# Patient Record
Sex: Female | Born: 1970 | Hispanic: No | Marital: Married | Smoking: Never smoker
Health system: Southern US, Community
[De-identification: ages and names within clinical notes are randomized; demographics above are authoritative.]

---

## 2001-06-12 ENCOUNTER — Ambulatory Visit (HOSPITAL_COMMUNITY): Admission: RE | Admit: 2001-06-12 | Discharge: 2001-06-12 | Payer: Self-pay | Admitting: Internal Medicine

## 2001-06-12 ENCOUNTER — Encounter: Payer: Self-pay | Admitting: Internal Medicine

## 2001-09-29 ENCOUNTER — Ambulatory Visit (HOSPITAL_COMMUNITY): Admission: RE | Admit: 2001-09-29 | Discharge: 2001-09-29 | Payer: Self-pay | Admitting: *Deleted

## 2001-11-05 ENCOUNTER — Encounter: Admission: RE | Admit: 2001-11-05 | Discharge: 2001-11-05 | Payer: Self-pay | Admitting: *Deleted

## 2001-11-12 ENCOUNTER — Encounter: Admission: RE | Admit: 2001-11-12 | Discharge: 2001-11-12 | Payer: Self-pay | Admitting: *Deleted

## 2001-11-20 ENCOUNTER — Ambulatory Visit (HOSPITAL_COMMUNITY): Admission: RE | Admit: 2001-11-20 | Discharge: 2001-11-20 | Payer: Self-pay | Admitting: *Deleted

## 2001-11-26 ENCOUNTER — Encounter: Admission: RE | Admit: 2001-11-26 | Discharge: 2001-11-26 | Payer: Self-pay | Admitting: *Deleted

## 2001-12-10 ENCOUNTER — Encounter: Admission: RE | Admit: 2001-12-10 | Discharge: 2001-12-10 | Payer: Self-pay | Admitting: *Deleted

## 2001-12-24 ENCOUNTER — Encounter: Admission: RE | Admit: 2001-12-24 | Discharge: 2001-12-24 | Payer: Self-pay | Admitting: *Deleted

## 2001-12-30 ENCOUNTER — Ambulatory Visit (HOSPITAL_COMMUNITY): Admission: RE | Admit: 2001-12-30 | Discharge: 2001-12-30 | Payer: Self-pay | Admitting: *Deleted

## 2001-12-31 ENCOUNTER — Encounter: Admission: RE | Admit: 2001-12-31 | Discharge: 2001-12-31 | Payer: Self-pay | Admitting: *Deleted

## 2001-12-31 ENCOUNTER — Encounter (HOSPITAL_COMMUNITY): Admission: AD | Admit: 2001-12-31 | Discharge: 2002-01-30 | Payer: Self-pay | Admitting: *Deleted

## 2002-01-07 ENCOUNTER — Encounter: Admission: RE | Admit: 2002-01-07 | Discharge: 2002-01-07 | Payer: Self-pay | Admitting: Obstetrics and Gynecology

## 2002-01-21 ENCOUNTER — Encounter: Admission: RE | Admit: 2002-01-21 | Discharge: 2002-01-21 | Payer: Self-pay | Admitting: *Deleted

## 2002-01-28 ENCOUNTER — Encounter: Admission: RE | Admit: 2002-01-28 | Discharge: 2002-01-28 | Payer: Self-pay | Admitting: *Deleted

## 2002-02-11 ENCOUNTER — Encounter: Admission: RE | Admit: 2002-02-11 | Discharge: 2002-02-11 | Payer: Self-pay | Admitting: *Deleted

## 2002-02-18 ENCOUNTER — Encounter: Admission: RE | Admit: 2002-02-18 | Discharge: 2002-02-18 | Payer: Self-pay | Admitting: *Deleted

## 2002-02-18 ENCOUNTER — Ambulatory Visit (HOSPITAL_COMMUNITY): Admission: RE | Admit: 2002-02-18 | Discharge: 2002-02-18 | Payer: Self-pay | Admitting: *Deleted

## 2002-02-25 ENCOUNTER — Encounter: Admission: RE | Admit: 2002-02-25 | Discharge: 2002-02-25 | Payer: Self-pay | Admitting: *Deleted

## 2002-02-26 ENCOUNTER — Ambulatory Visit (HOSPITAL_COMMUNITY): Admission: RE | Admit: 2002-02-26 | Discharge: 2002-02-26 | Payer: Self-pay | Admitting: *Deleted

## 2002-03-04 ENCOUNTER — Encounter: Admission: RE | Admit: 2002-03-04 | Discharge: 2002-03-04 | Payer: Self-pay | Admitting: *Deleted

## 2002-03-11 ENCOUNTER — Encounter: Admission: RE | Admit: 2002-03-11 | Discharge: 2002-03-11 | Payer: Self-pay | Admitting: *Deleted

## 2002-03-16 ENCOUNTER — Observation Stay (HOSPITAL_COMMUNITY): Admission: EM | Admit: 2002-03-16 | Discharge: 2002-03-17 | Payer: Self-pay | Admitting: *Deleted

## 2002-03-17 ENCOUNTER — Encounter: Payer: Self-pay | Admitting: *Deleted

## 2002-03-25 ENCOUNTER — Encounter: Admission: RE | Admit: 2002-03-25 | Discharge: 2002-03-25 | Payer: Self-pay | Admitting: *Deleted

## 2002-03-27 ENCOUNTER — Inpatient Hospital Stay (HOSPITAL_COMMUNITY): Admission: AD | Admit: 2002-03-27 | Discharge: 2002-03-27 | Payer: Self-pay | Admitting: Obstetrics and Gynecology

## 2002-03-31 ENCOUNTER — Encounter: Admission: RE | Admit: 2002-03-31 | Discharge: 2002-03-31 | Payer: Self-pay | Admitting: *Deleted

## 2002-03-31 ENCOUNTER — Inpatient Hospital Stay (HOSPITAL_COMMUNITY): Admission: AD | Admit: 2002-03-31 | Discharge: 2002-03-31 | Payer: Self-pay | Admitting: Obstetrics and Gynecology

## 2002-03-31 ENCOUNTER — Encounter: Payer: Self-pay | Admitting: Obstetrics and Gynecology

## 2002-04-07 ENCOUNTER — Encounter: Admission: RE | Admit: 2002-04-07 | Discharge: 2002-04-07 | Payer: Self-pay | Admitting: *Deleted

## 2002-04-09 ENCOUNTER — Encounter (HOSPITAL_COMMUNITY): Admission: RE | Admit: 2002-04-09 | Discharge: 2002-04-09 | Payer: Self-pay | Admitting: Family Medicine

## 2002-04-11 ENCOUNTER — Encounter (INDEPENDENT_AMBULATORY_CARE_PROVIDER_SITE_OTHER): Payer: Self-pay

## 2002-04-11 ENCOUNTER — Inpatient Hospital Stay (HOSPITAL_COMMUNITY): Admission: AD | Admit: 2002-04-11 | Discharge: 2002-04-15 | Payer: Self-pay | Admitting: *Deleted

## 2002-04-26 ENCOUNTER — Inpatient Hospital Stay (HOSPITAL_COMMUNITY): Admission: AD | Admit: 2002-04-26 | Discharge: 2002-04-28 | Payer: Self-pay | Admitting: *Deleted

## 2002-05-03 ENCOUNTER — Inpatient Hospital Stay (HOSPITAL_COMMUNITY): Admission: AD | Admit: 2002-05-03 | Discharge: 2002-05-03 | Payer: Self-pay | Admitting: *Deleted

## 2002-05-25 ENCOUNTER — Encounter (INDEPENDENT_AMBULATORY_CARE_PROVIDER_SITE_OTHER): Payer: Self-pay | Admitting: *Deleted

## 2002-05-25 ENCOUNTER — Encounter: Admission: RE | Admit: 2002-05-25 | Discharge: 2002-05-25 | Payer: Self-pay | Admitting: *Deleted

## 2002-07-15 ENCOUNTER — Encounter: Admission: RE | Admit: 2002-07-15 | Discharge: 2002-07-15 | Payer: Self-pay | Admitting: *Deleted

## 2002-08-24 ENCOUNTER — Encounter: Admission: RE | Admit: 2002-08-24 | Discharge: 2002-08-24 | Payer: Self-pay | Admitting: *Deleted

## 2002-08-31 ENCOUNTER — Encounter: Payer: Self-pay | Admitting: Emergency Medicine

## 2002-08-31 ENCOUNTER — Emergency Department (HOSPITAL_COMMUNITY): Admission: EM | Admit: 2002-08-31 | Discharge: 2002-08-31 | Payer: Self-pay | Admitting: Emergency Medicine

## 2002-11-25 ENCOUNTER — Inpatient Hospital Stay (HOSPITAL_COMMUNITY): Admission: AD | Admit: 2002-11-25 | Discharge: 2002-11-25 | Payer: Self-pay | Admitting: Obstetrics & Gynecology

## 2002-12-02 ENCOUNTER — Encounter: Admission: RE | Admit: 2002-12-02 | Discharge: 2002-12-02 | Payer: Self-pay | Admitting: Obstetrics and Gynecology

## 2002-12-07 ENCOUNTER — Ambulatory Visit (HOSPITAL_COMMUNITY): Admission: RE | Admit: 2002-12-07 | Discharge: 2002-12-07 | Payer: Self-pay | Admitting: *Deleted

## 2003-03-03 ENCOUNTER — Encounter: Admission: RE | Admit: 2003-03-03 | Discharge: 2003-03-03 | Payer: Self-pay | Admitting: Obstetrics and Gynecology

## 2003-07-19 ENCOUNTER — Emergency Department (HOSPITAL_COMMUNITY): Admission: EM | Admit: 2003-07-19 | Discharge: 2003-07-19 | Payer: Self-pay | Admitting: Family Medicine

## 2004-02-18 ENCOUNTER — Ambulatory Visit (HOSPITAL_COMMUNITY): Admission: RE | Admit: 2004-02-18 | Discharge: 2004-02-18 | Payer: Self-pay | Admitting: Infectious Diseases

## 2004-08-20 ENCOUNTER — Other Ambulatory Visit: Admission: RE | Admit: 2004-08-20 | Discharge: 2004-08-20 | Payer: Self-pay | Admitting: Family Medicine

## 2005-07-02 ENCOUNTER — Encounter (INDEPENDENT_AMBULATORY_CARE_PROVIDER_SITE_OTHER): Payer: Self-pay | Admitting: Specialist

## 2005-07-02 ENCOUNTER — Inpatient Hospital Stay (HOSPITAL_COMMUNITY): Admission: RE | Admit: 2005-07-02 | Discharge: 2005-07-05 | Payer: Self-pay | Admitting: Obstetrics and Gynecology

## 2008-03-07 ENCOUNTER — Ambulatory Visit (HOSPITAL_COMMUNITY): Admission: RE | Admit: 2008-03-07 | Discharge: 2008-03-07 | Payer: Self-pay | Admitting: Obstetrics & Gynecology

## 2008-04-19 ENCOUNTER — Inpatient Hospital Stay (HOSPITAL_COMMUNITY): Admission: AD | Admit: 2008-04-19 | Discharge: 2008-04-19 | Payer: Self-pay | Admitting: Obstetrics & Gynecology

## 2008-04-27 ENCOUNTER — Ambulatory Visit (HOSPITAL_COMMUNITY): Admission: RE | Admit: 2008-04-27 | Discharge: 2008-04-27 | Payer: Self-pay | Admitting: Family Medicine

## 2008-05-11 ENCOUNTER — Ambulatory Visit (HOSPITAL_COMMUNITY): Admission: RE | Admit: 2008-05-11 | Discharge: 2008-05-11 | Payer: Self-pay | Admitting: Obstetrics & Gynecology

## 2008-05-26 ENCOUNTER — Inpatient Hospital Stay (HOSPITAL_COMMUNITY): Admission: RE | Admit: 2008-05-26 | Discharge: 2008-05-26 | Payer: Self-pay | Admitting: Obstetrics & Gynecology

## 2008-05-26 ENCOUNTER — Ambulatory Visit: Payer: Self-pay | Admitting: Physician Assistant

## 2008-06-02 ENCOUNTER — Ambulatory Visit: Payer: Self-pay | Admitting: Obstetrics & Gynecology

## 2008-06-23 ENCOUNTER — Ambulatory Visit: Payer: Self-pay | Admitting: Family Medicine

## 2008-07-07 ENCOUNTER — Ambulatory Visit: Payer: Self-pay | Admitting: Obstetrics & Gynecology

## 2008-07-07 ENCOUNTER — Encounter: Payer: Self-pay | Admitting: Family Medicine

## 2008-07-07 LAB — CONVERTED CEMR LAB: GC Probe Amp, Genital: NEGATIVE

## 2008-07-08 ENCOUNTER — Encounter: Payer: Self-pay | Admitting: Family Medicine

## 2008-07-14 ENCOUNTER — Ambulatory Visit: Payer: Self-pay | Admitting: Family Medicine

## 2008-07-21 ENCOUNTER — Ambulatory Visit: Payer: Self-pay | Admitting: Family Medicine

## 2008-07-24 ENCOUNTER — Ambulatory Visit: Payer: Self-pay | Admitting: Obstetrics & Gynecology

## 2008-07-24 ENCOUNTER — Inpatient Hospital Stay (HOSPITAL_COMMUNITY): Admission: AD | Admit: 2008-07-24 | Discharge: 2008-07-27 | Payer: Self-pay | Admitting: Obstetrics & Gynecology

## 2010-05-30 IMAGING — CR DG CHEST 2V
2 series · 2 of 2 positions shown · non-contrast
Comparison: February 18, 2004.

CLINICAL DATA: Cough and congestion in 24-week pregnant patient

CHEST - 2 VIEW

[view not recorded (1 of 2)]
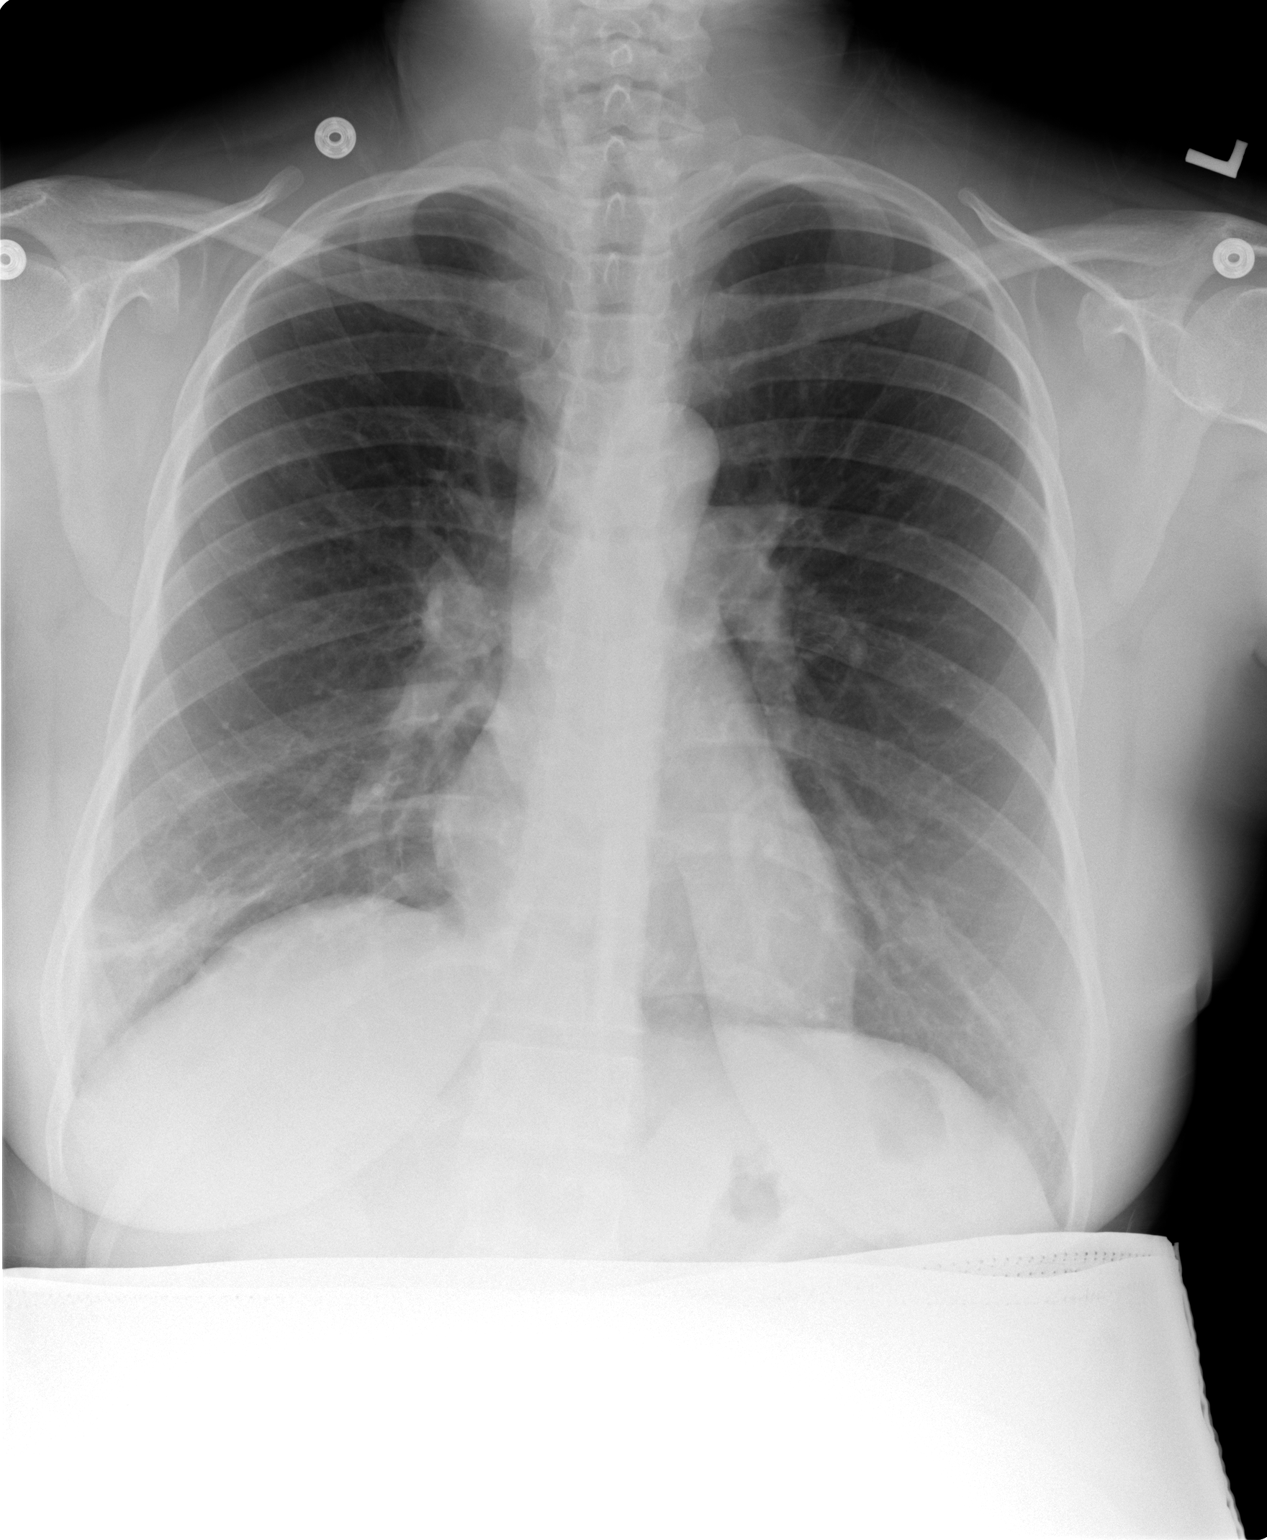

[view not recorded (2 of 2)]
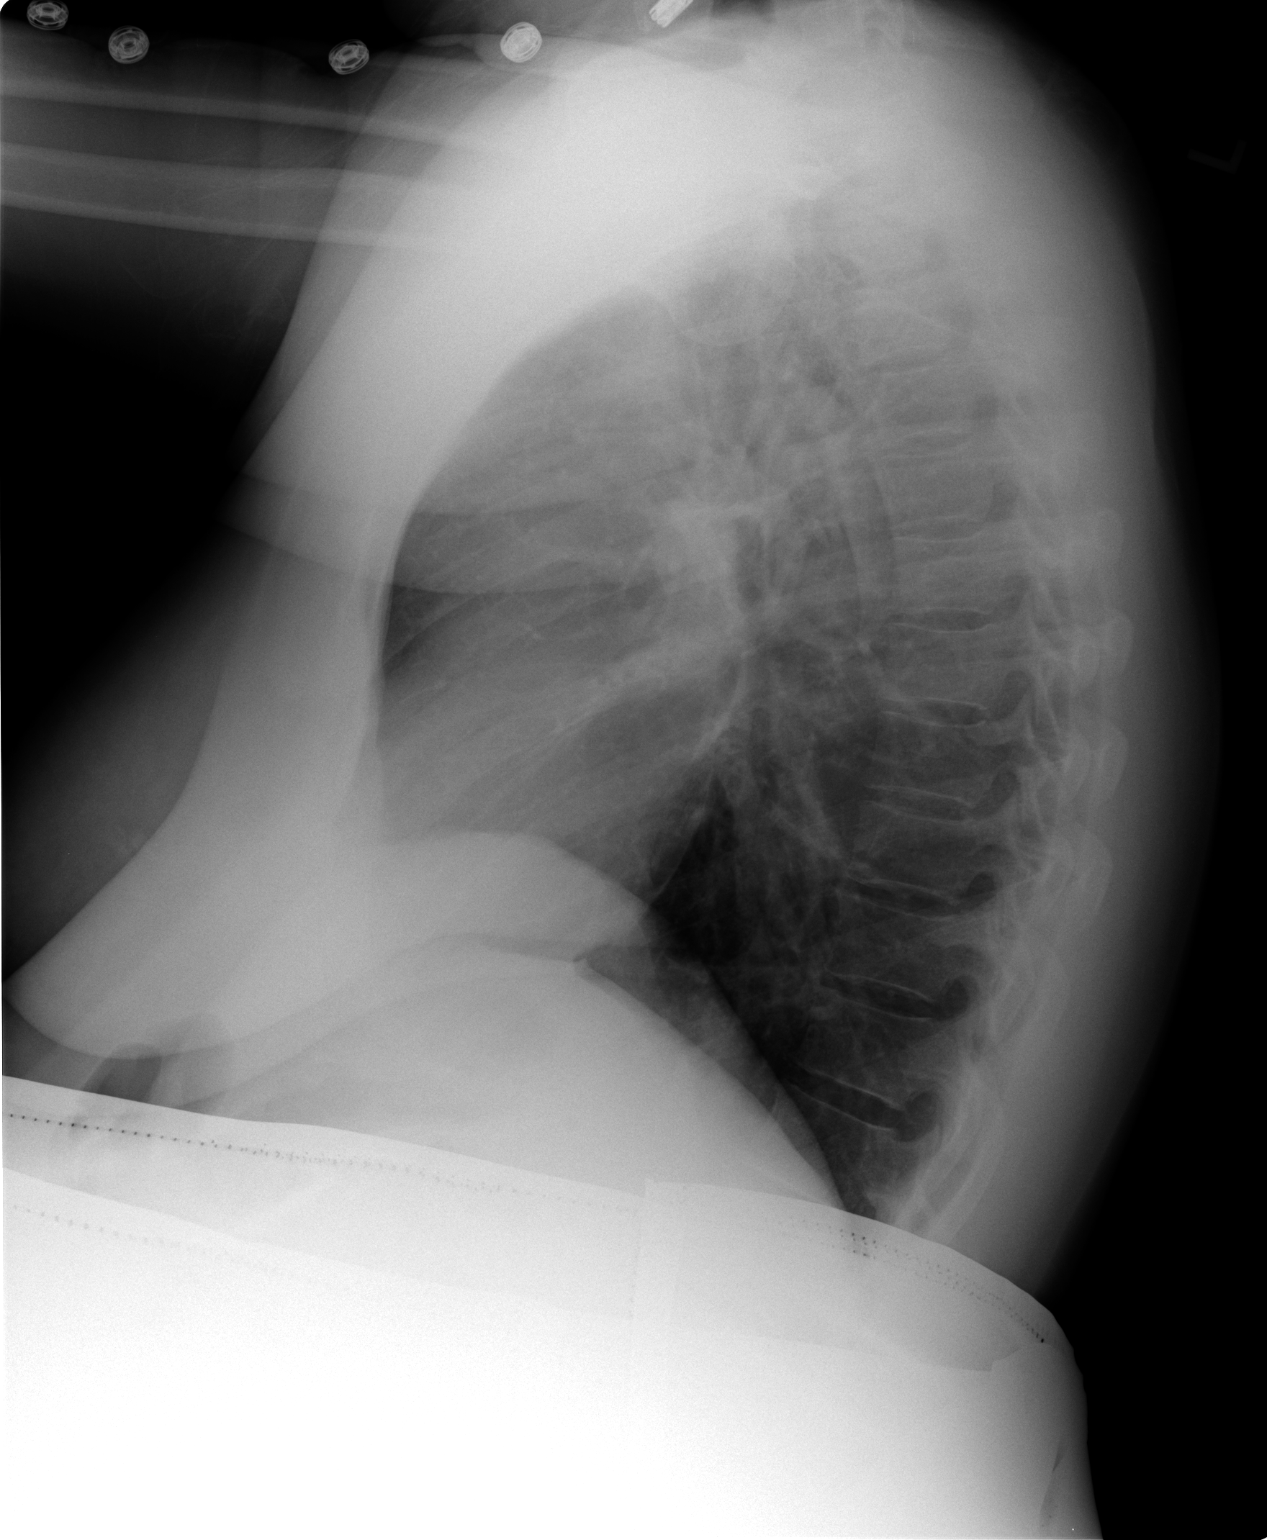

[2 of 2 positions shown; findings below may reference images not displayed]

FINDINGS: There is an infiltrate in the posterior lateral right
lower lobe.   No pleural effusion is identified.  The left lung is
clear.  The cardiac silhouette, mediastinum, pulmonary vasculature
are within normal limits
IMPRESSION: Right lower lobe pneumonia

## 2010-06-04 ENCOUNTER — Ambulatory Visit: Payer: Self-pay | Admitting: Nurse Practitioner

## 2010-06-04 DIAGNOSIS — J45909 Unspecified asthma, uncomplicated: Secondary | ICD-10-CM | POA: Insufficient documentation

## 2010-06-04 DIAGNOSIS — D259 Leiomyoma of uterus, unspecified: Secondary | ICD-10-CM | POA: Insufficient documentation

## 2010-06-04 DIAGNOSIS — J309 Allergic rhinitis, unspecified: Secondary | ICD-10-CM | POA: Insufficient documentation

## 2010-06-11 ENCOUNTER — Ambulatory Visit (HOSPITAL_COMMUNITY)
Admission: RE | Admit: 2010-06-11 | Discharge: 2010-06-11 | Payer: Self-pay | Source: Home / Self Care | Attending: Internal Medicine | Admitting: Internal Medicine

## 2010-06-12 ENCOUNTER — Telehealth (INDEPENDENT_AMBULATORY_CARE_PROVIDER_SITE_OTHER): Payer: Self-pay | Admitting: Nurse Practitioner

## 2010-06-19 ENCOUNTER — Encounter (INDEPENDENT_AMBULATORY_CARE_PROVIDER_SITE_OTHER): Payer: Self-pay | Admitting: *Deleted

## 2010-06-21 IMAGING — US US OB FOLLOW-UP
1 series · 14 of 28 positions shown · non-contrast
Comparison: none

OBSTETRICAL ULTRASOUND:
 This ultrasound exam was performed in the [HOSPITAL] Ultrasound Department.  The OB US report was generated in the AS system, and faxed to the ordering physician.  This report is also available in [REDACTED] PACS.

[Series 1: us ob follow-up · 0.31mm/px · 14 of 42 slices shown]
[im 2/42]
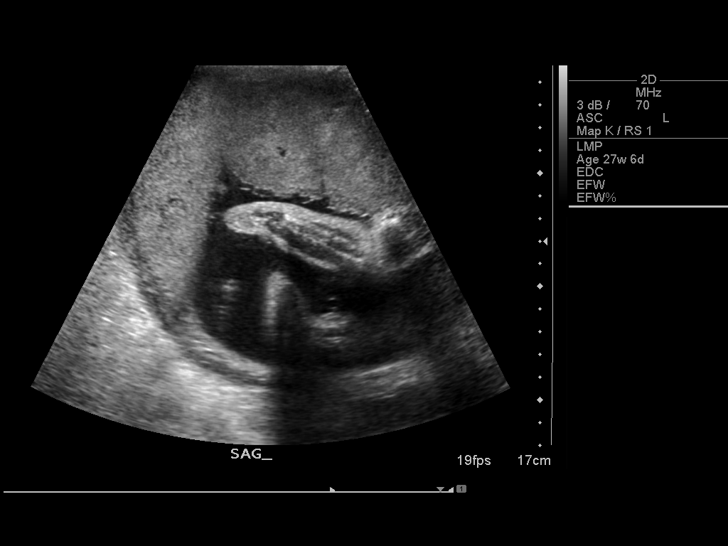
[im 5/42]
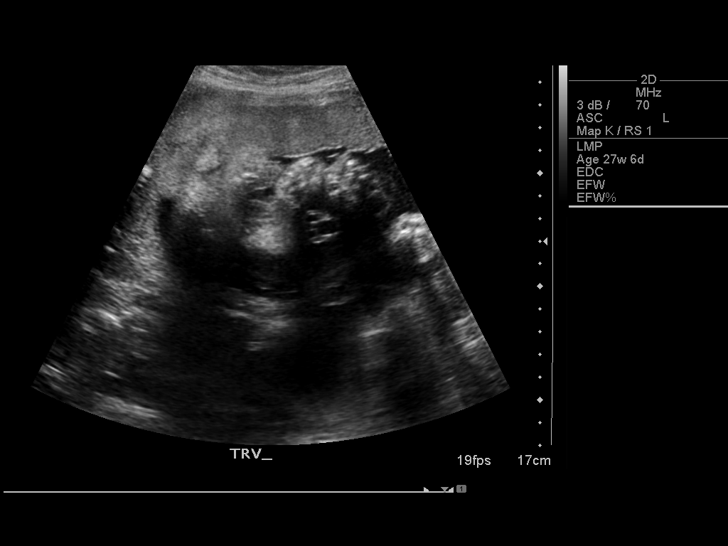
[im 8/42]
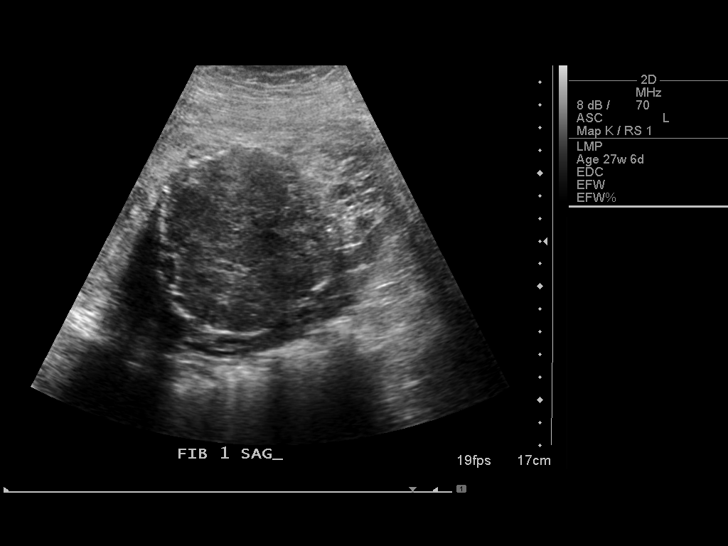
[im 11/42]
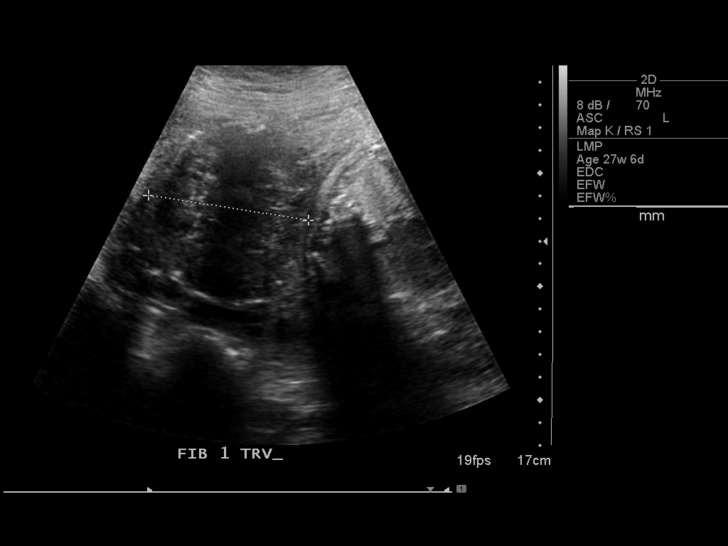
[im 14/42]
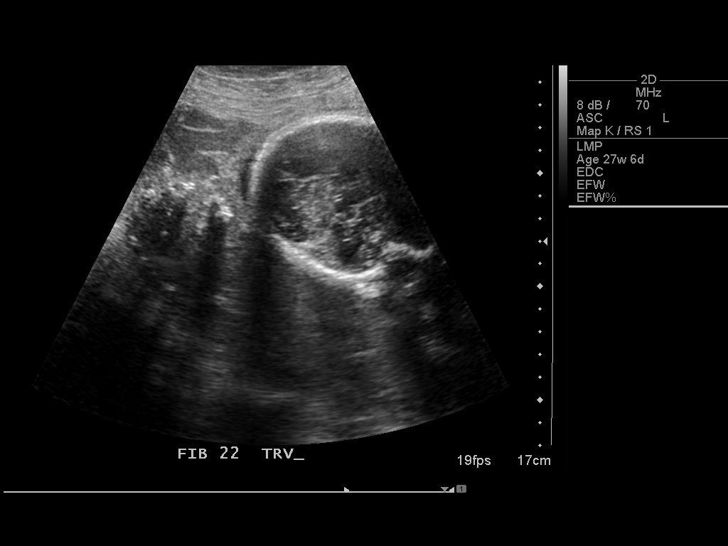
[im 17/42]
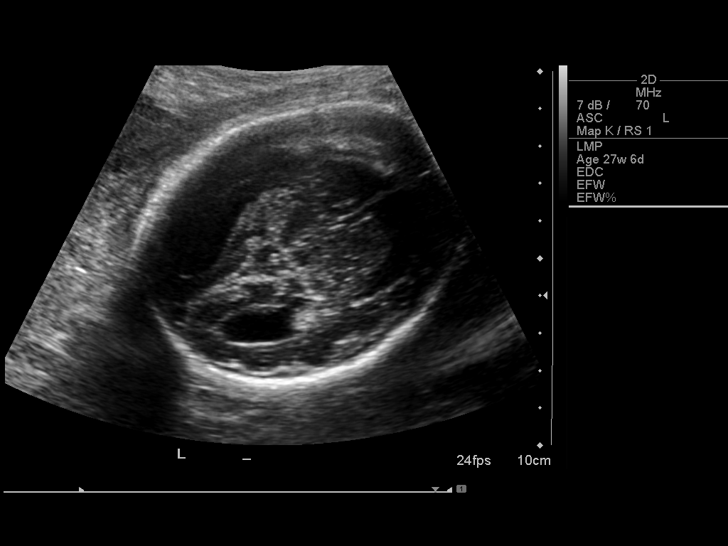
[im 20/42]
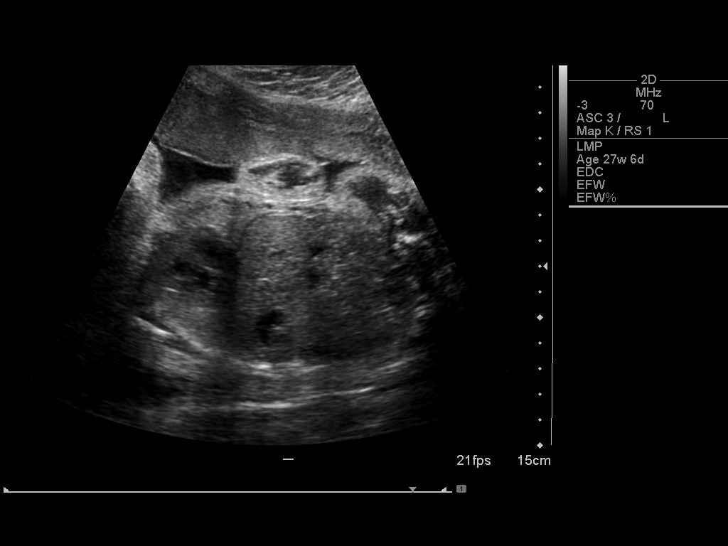
[im 23/42]
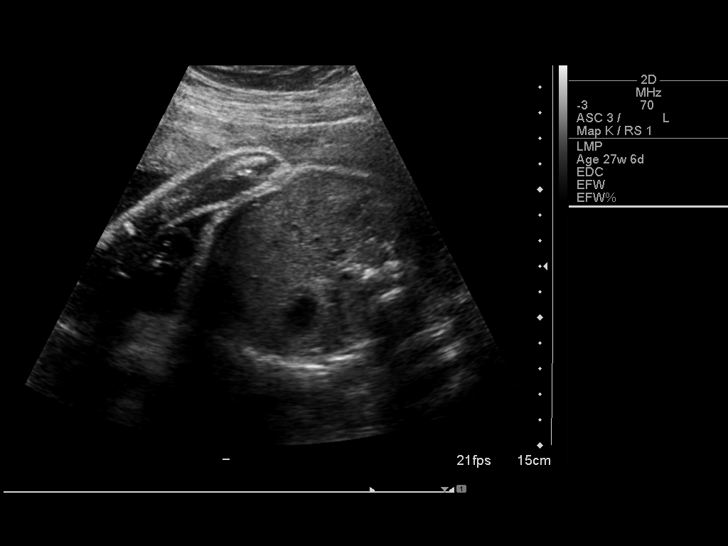
[im 26/42]
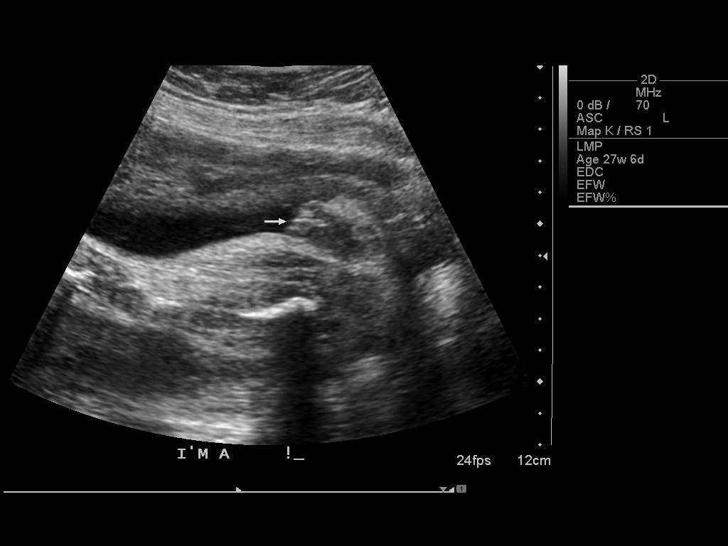
[im 29/42]
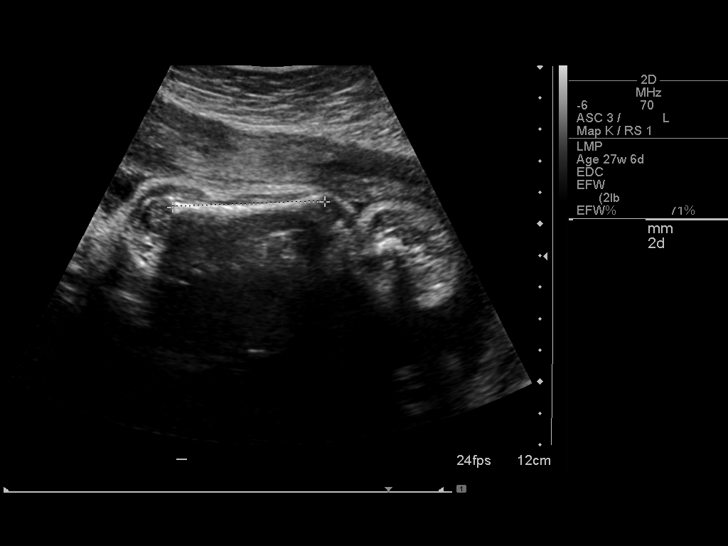
[im 32/42]
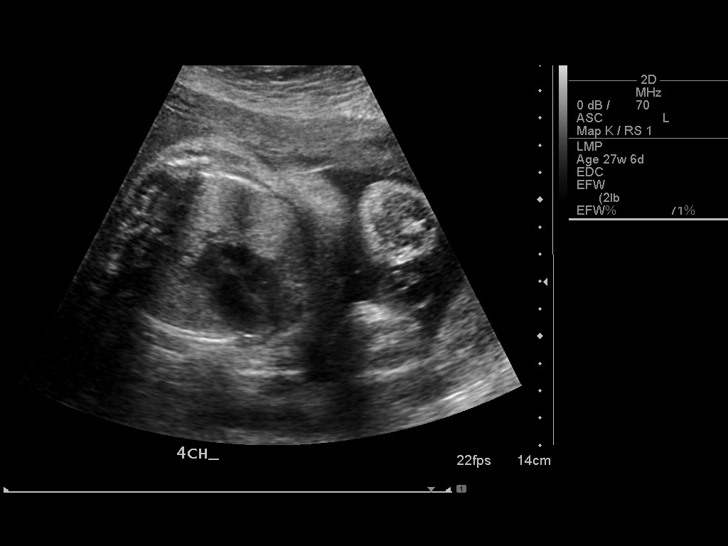
[im 35/42]
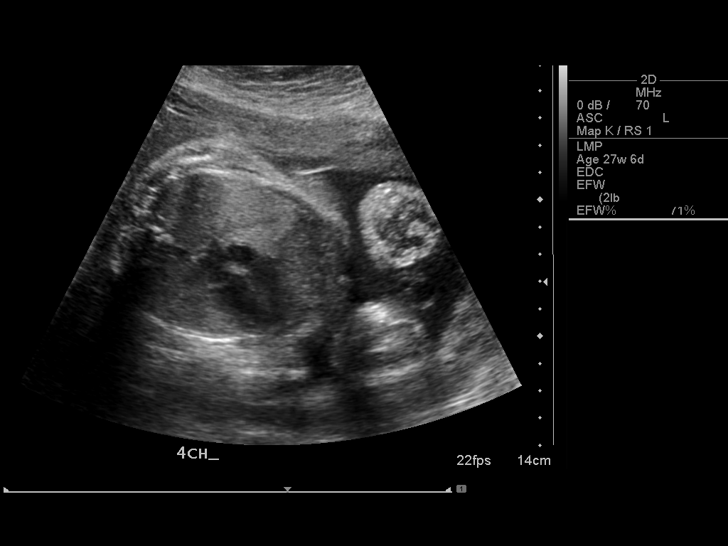
[im 38/42]
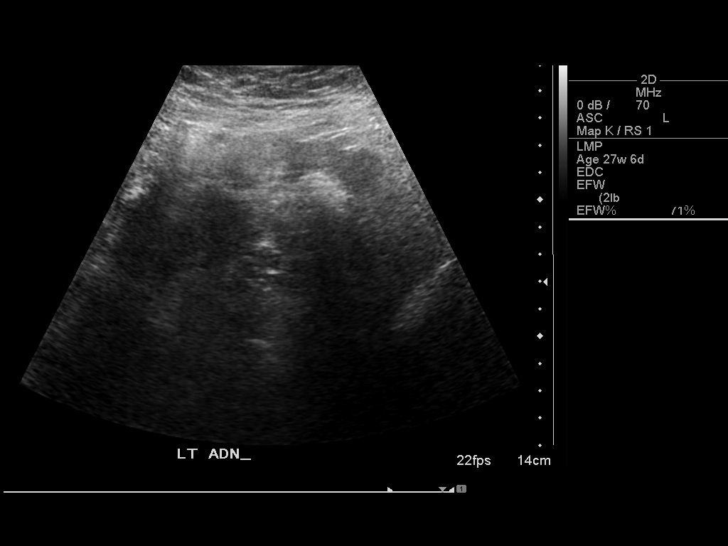
[im 42/42]
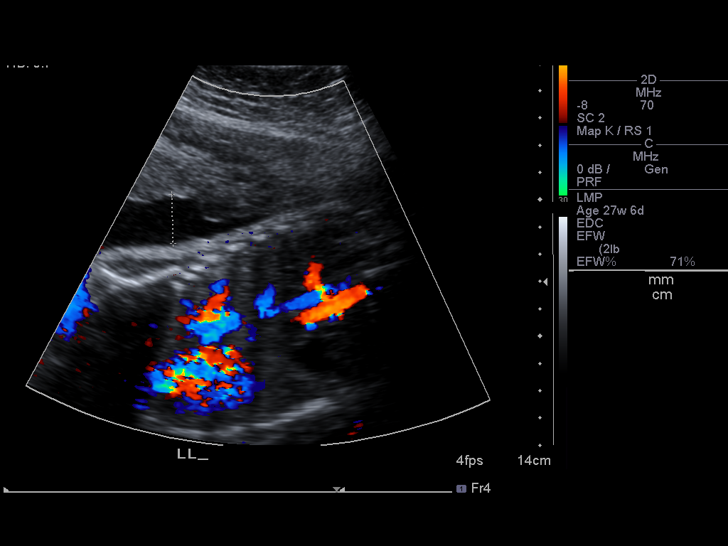

[14 of 28 positions shown; findings below may reference images not displayed]

IMPRESSION: See AS Obstetric US report.

## 2010-07-19 ENCOUNTER — Ambulatory Visit
Admission: RE | Admit: 2010-07-19 | Discharge: 2010-07-19 | Payer: Self-pay | Source: Home / Self Care | Attending: Nurse Practitioner | Admitting: Nurse Practitioner

## 2010-07-19 DIAGNOSIS — N949 Unspecified condition associated with female genital organs and menstrual cycle: Secondary | ICD-10-CM | POA: Insufficient documentation

## 2010-07-19 DIAGNOSIS — N938 Other specified abnormal uterine and vaginal bleeding: Secondary | ICD-10-CM | POA: Insufficient documentation

## 2010-07-26 NOTE — Assessment & Plan Note (Signed)
Summary: NEW - Establish care   Vital Signs:  Patient profile:   40 year old female Menstrual status:  regular LMP:     05/20/2010 Height:      61.25 inches Weight:      1917 pounds BMI:     360.56 O2 Sat:      100 % on Room air Temp:     98.1 degrees F oral Pulse rate:   96 / minute Pulse rhythm:   regular Resp:     20 per minute BP sitting:   120 / 80  (left arm) Cuff size:   regular  Vitals Entered By: Levon Hedger (June 04, 2010 2:41 PM)  Nutrition Counseling: Patient's BMI is greater than 25 and therefore counseled on weight management options.  O2 Flow:  Room air  Serial Vital Signs/Assessments:                                PEF    PreRx  PostRx Time      O2 Sat  O2 Type     L/min  L/min  L/min   By           100 %   Room air                          Levon Hedger  Comments: P/F  200,  210,  200 By: Levon Hedger   Is Patient Diabetic? No Pain Assessment Patient in pain? yes       Does patient need assistance? Functional Status Self care Ambulation Normal LMP (date): 05/20/2010 LMP - Character: heavy     Menstrual flow (days): 5-6 Menstrual Status regular Enter LMP: 05/20/2010   History of Present Illness:  Pt into the office to establish care. No recent  hospital or urgent care visit. No previous PCP  Social - umployed at this time  Asthma History    Initial Asthma Severity Rating:    Age range: 12+ years    Symptoms: 0-2 days/week    Nighttime Awakenings: >1/week but not nightly    Interferes w/ normal activity: minor limitations    Exacerbations requiring oral systemic steroids: 0-1/year    Asthma Severity Assessment: Moderate Persistent    Habits & Providers  Alcohol-Tobacco-Diet     Alcohol drinks/day: 0     Tobacco Status: never  Exercise-Depression-Behavior     Have you felt down or hopeless? no     Have you felt little pleasure in things? no     Depression Counseling: not indicated; screening negative for  depression     Drug Use: never  Medications Prior to Update: 1)  None  Allergies (verified): No Known Drug Allergies  Past History:  Past Surgical History: c-section x 3  Family History: mother - diabetes, htn, cva father - prostate cancer (deceased at age 19) Sisters x 4 with thyriod problems  Social History: 3 children married Tobacco - none ETOH - none Drug - none Originally from [place] Status:  never Drug Use:  never  Review of Systems General:  Denies fever. ENT:  Complains of nasal congestion and sinus pressure; denies sore throat. CV:  Denies chest pain or discomfort. Resp:  Complains of cough, shortness of breath, and wheezing; denies sputum productive. MS:  Complains of low back pain. Neuro:  Complains of headaches. Allergy:  Complains of itching eyes.  Physical Exam  General:  alert.   Head:  normocephalic.   Ears:  bil TM with bony landmarks present Nose:  inflammed turbinates (extremed) Lungs:  decreases bases Heart:  normal rate and regular rhythm.   Abdomen:  normal bowel sounds.   Msk:  up to the exam table Neurologic:  alert & oriented X3.   Skin:  color normal.   Psych:  Oriented X3.     Impression & Recommendations:  Problem # 1:  ASTHMA (ICD-493.90)  Her updated medication list for this problem includes:    Singulair 10 Mg Tabs (Montelukast sodium) ..... One tablet by mouth nightly for breathing    Ventolin Hfa 108 (90 Base) Mcg/act Aers (Albuterol sulfate) .Marland Kitchen... 2 puffs every 6 hours as needed for shortness of breath    Symbicort 160-4.5 Mcg/act Aero (Budesonide-formoterol fumarate) ..... One spray two times a day  Orders: Peak Flow Rate (94150) Pulse Oximetry (single measurment) (94760)  Problem # 2:  ALLERGIC RHINITIS (ICD-477.9)  Her updated medication list for this problem includes:    Claritin 10 Mg Tabs (Loratadine) ..... One tablet by mouth daily    Rhinocort Aqua 32 Mcg/act Susp (Budesonide) ..... One spray in each  nostril two times a day  Problem # 3:  FIBROIDS, UTERUS (ICD-218.9)  Orders: Ultrasound (Ultrasound)  Problem # 4:  NEED PROPHYLACTIC VACCINATION&INOCULATION FLU (ICD-V04.81) given today  Complete Medication List: 1)  Multivitamins Caps (Multiple vitamin) .... One tablet by mouth daily 2)  Claritin 10 Mg Tabs (Loratadine) .... One tablet by mouth daily 3)  Singulair 10 Mg Tabs (Montelukast sodium) .... One tablet by mouth nightly for breathing 4)  Ventolin Hfa 108 (90 Base) Mcg/act Aers (Albuterol sulfate) .... 2 puffs every 6 hours as needed for shortness of breath 5)  Symbicort 160-4.5 Mcg/act Aero (Budesonide-formoterol fumarate) .... One spray two times a day 6)  Rhinocort Aqua 32 Mcg/act Susp (Budesonide) .... One spray in each nostril two times a day  Other Orders: Flu Vaccine 33yrs + (81191) Admin 1st Vaccine (47829)  Asthma Management Plan    Asthma Severity: Moderate Persistent    Personal best PEF: 210 liters/minute    Predicted PEF: 474 liters/minute    Working PEF: 474 liters/minute    Plan based on PEF formula: [place] [employer] Zone: (Range: 380 to 470) SYMBICORT 160-4.5 MCG/ACT AERO:  1 inhalation twice a day SINGULAIR 10 MG TABS:  1 tablet daily  Yellow Zone: VENTOLIN HFA 108 (90 BASE) MCG/ACT AERS:  2 puffs every 4-6 hours  Red Zone: Call your physician for shortness of breath.    Patient Instructions: 1)  You can start taking a decongestant such as claritin D to help with nasal congestion. 2)  Will start rhinocort one spray in each nostril two times a day (hold head down) 3)  Asthma - will start singulair 10mg  by mouth nightly for asthma. 4)  Also use symbicort - 1 puff by mouth two times a day (sample) until your next visit 5)  You have been the flu vaccine today. 6)  Fibroid - you will be referred for a pelvic and transvaginal ultrasound.  This will check both your uterus and ovaries. 7)  Call next week for a follow up appointment in 4-6 weeks for  asthma and allergies 8)  You will need a complete physical exam scheduled at next visit Prescriptions: RHINOCORT AQUA 32 MCG/ACT SUSP (BUDESONIDE) One spray in each nostril two times a day  #1 x 3   Entered and Authorized by:  Lehman Prom FNP   Signed by:   Lehman Prom FNP on 06/04/2010   Method used:   Faxed to ...       [facility] (retail)       [address] [place], [place]  [address]       Ph: [phone]       Fax: [phone]   RxID:   [id number] SYMBICORT 160-4.5 MCG/ACT AERO (BUDESONIDE-FORMOTEROL FUMARATE) One spray two times a day  #1 x 0   Entered and Authorized by:   Lehman Prom FNP   Signed by:   Lehman Prom FNP on 06/04/2010   Method used:   Samples Given   RxID:   [id number] VENTOLIN HFA 108 (90 BASE) MCG/ACT AERS (ALBUTEROL SULFATE) 2 puffs every 6 hours as needed for shortness of breath  #1 x 1   Entered and Authorized by:   Lehman Prom FNP   Signed by:   Lehman Prom FNP on 06/04/2010   Method used:   Faxed to ...       [facility] (retail)       [address] [place], [place]  [address]       Ph: [phone]       Fax: [phone]   RxID:   [id number] SINGULAIR 10 MG TABS (MONTELUKAST SODIUM) One tablet by mouth nightly for breathing  #30 x 5   Entered and Authorized by:   Lehman Prom FNP   Signed by:   Lehman Prom FNP on 06/04/2010   Method used:   Faxed to ...       [facility] (retail)       [address] [place], [place]  [address]       Ph: [phone]       Fax: [phone]   RxID:   [id number]    Orders Added: 1)  Peak Flow Rate [94150] 2)  Pulse Oximetry (single measurment) [94760] 3)  New Patient Level III [99203] 4)  Ultrasound [Ultrasound] 5)  Flu Vaccine 9yrs + [90658] 6)  Admin 1st Vaccine [90471]   Immunizations Administered:  Influenza  Vaccine # 1:    Vaccine Type: Fluvax 3+    Site: left deltoid    Mfr: GlaxoSmithKline    Dose: 0.5 ml    Route: IM    Given by: Levon Hedger    Exp. Date: 12/22/2010    Lot #: [id number]    VIS given: 01/16/10 version given June 04, 2010.  Flu Vaccine Consent Questions:    Do you have a history of severe allergic reactions to this vaccine? no    Any prior history of allergic reactions to egg and/or gelatin? no    Do you have a sensitivity to the preservative Thimersol? no    Do you have a past history of Guillan-Barre Syndrome? no    Do you currently have an acute febrile illness? no    Have you ever had a severe reaction to latex? no    Vaccine information given and explained to patient? yes    Are you currently pregnant? no   Immunizations Administered:  Influenza Vaccine # 1:    Vaccine Type: Fluvax 3+    Site: left deltoid    Mfr: GlaxoSmithKline    Dose: 0.5 ml    Route: IM  Given by: Levon Hedger    Exp. Date: 12/22/2010    Lot #: [id number]    VIS given: 01/16/10 version given June 04, 2010.  Prevention & Chronic Care Immunizations   Influenza vaccine: Fluvax 3+  (06/04/2010)    Tetanus booster: Not documented    Pneumococcal vaccine: Not documented  Other Screening   Pap smear: Not documented   Smoking status: never  (06/04/2010)  Lipids   Total Cholesterol: Not documented   LDL: Not documented   LDL Direct: Not documented   HDL: Not documented   Triglycerides: Not documented   Nursing Instructions: Give Flu vaccine today

## 2010-07-26 NOTE — Letter (Signed)
Summary: TEST ORDER FORM//ULTRASOUND//APPT DATE & TIME  TEST ORDER FORM//ULTRASOUND//APPT DATE & TIME   Imported By: Arta Bruce 06/05/2010 16:16:43  _____________________________________________________________________  External Attachment:    Type:   Image     Comment:   External Document

## 2010-07-26 NOTE — Letter (Signed)
Summary: Handout Printed  Printed Handout:  - Fibroids (Leiomyoma's) 

## 2010-07-26 NOTE — Assessment & Plan Note (Signed)
Summary: Fibroids   Vital Signs:  Patient profile:   40 year old female Menstrual status:  irregular LMP:     06/14/2010 Weight:      190.6 pounds Pulse rate:   80 / minute Pulse rhythm:   regular Resp:     20 per minute BP sitting:   116 / 84  (left arm) Cuff size:   regular  Vitals Entered By: Levon Hedger (July 19, 2010 12:07 PM) CC: results from u/s Is Patient Diabetic? No Pain Assessment Patient in pain? no       Does patient need assistance? Functional Status Self care Ambulation Normal LMP (date): 06/14/2010 EDC by LMP==> 03/21/2011 EDC 03/21/2011 LMP - Character: heavy    Menses interval (days): 28 Menstrual flow (days): 5-6 Menstrual Status irregular Enter LMP: 06/14/2010   CC:  results from u/s.  History of Present Illness:  Pt into the office for f/u on ultrasound. Pt has ultrasound on last month which reveals multiple uterine fibroids. Pt missed her menses this month.  It is about 3-5 days late. pt has concerns that she is pregnant.  Currently has a baby that is almost 82 years old. Pt is still breast feeding. C/o breast tenderness now more than usual.  Allergies: No Known Drug Allergies  Review of Systems General:  +breast tenderness . CV:  Denies chest pain or discomfort. Resp:  Denies cough. GI:  Denies abdominal pain, nausea, and vomiting.  Physical Exam  General:  alert.   Head:  normocephalic.   Msk:  normal ROM.   Neurologic:  alert & oriented X3.   Skin:  color normal.   Psych:  Oriented X3.     Impression & Recommendations:  Problem # 1:  FIBROIDS, UTERUS (ICD-218.9) reviewed results with pt  Problem # 2:  DELAYED MENSES (ICD-626.8) pregnancy test positive today pt will need to establish at OB-GYN will start prenatal vitamins Orders: Urine Pregnancy Test  (04540)  Complete Medication List: 1)  Multivitamins Caps (Multiple vitamin) .... One tablet by mouth daily 2)  Claritin 10 Mg Tabs (Loratadine) .... One  tablet by mouth daily 3)  Singulair 10 Mg Tabs (Montelukast sodium) .... One tablet by mouth nightly for breathing 4)  Ventolin Hfa 108 (90 Base) Mcg/act Aers (Albuterol sulfate) .... 2 puffs every 6 hours as needed for shortness of breath 5)  Symbicort 160-4.5 Mcg/act Aero (Budesonide-formoterol fumarate) .... One spray two times a day 6)  Rhinocort Aqua 32 Mcg/act Susp (Budesonide) .... One spray in each nostril two times a day 7)  Prenatal/folic Acid Tabs (Prenatal vit-fe fumarate-fa) .... One tablet by mouth daily  Patient Instructions: 1)  Your pregnancy test is positive. 2)  LMP (date): 06/14/2010 3)  EDC by LMP==> 03/21/2011 4)  EDC 03/21/2011 5)  You should establish with an OB-GYN for your prenatal care. 6)  Start prenatal vitamins  Prescriptions: PRENATAL/FOLIC ACID  TABS (PRENATAL VIT-FE FUMARATE-FA) One tablet by mouth daily  #30 x 9   Entered and Authorized by:   Lehman Prom FNP   Signed by:   Lehman Prom FNP on 07/19/2010   Method used:   Print then Give to Patient   RxID:   [id number]    Orders Added: 1)  Est. Patient Level III [08657] 2)  Urine Pregnancy Test  [81025]    Laboratory Results   Urine Tests  Date/Time Received: July 19, 2010 1:00 PM     Urine HCG: positive

## 2010-07-26 NOTE — Letter (Signed)
Summary: *HSN Results Follow up  [facility]  [address] [place], [place] [address]   Phone: [phone]  Fax: [phone]      06/19/2010   Nancy [name] [address] [place], [place]  [address]   Dear  Ms. Ryan Deason,                            ____S.Drinkard,FNP   ____D. Gore,FNP       ____B. McPherson,MD   ____V. Rankins,MD    ____E. Mulberry,MD    ____N. Daphine Deutscher, FNP  ____D. Reche Dixon, MD    ____K. Philipp Deputy, MD    ____Other     This letter is to inform you that your recent test(s):  _______Pap Smear    _______Lab Test     _______X-ray    _______ is within acceptable limits  _______ requires a medication change  _______ requires a follow-up lab visit  _______ requires a follow-up visit with your provider   Comments:  We have been trying to reach you at [phone].  Please contact the office at your earliest convenience.       _________________________________________________________ If you have any questions, please contact our office                     Sincerely,  Levon Hedger [facility]

## 2010-07-26 NOTE — Progress Notes (Signed)
Summary: Ultrasound results  Phone Note Outgoing Call   Summary of Call: notify pt that her ultrasound shows that she has multiple uterine fibroids this is likely the cause for irregular and heavy periods pt should have a f/u appt and I can discuss further with her at that time but wanted to let her know results now Initial call taken by: Lehman Prom FNP,  June 12, 2010 8:20 AM  Follow-up for Phone Call        Levon Hedger  June 13, 2010 1:36 PM Left message on machine for pt to return call to the office.   Left message on answering machine for pt to call back...Marland KitchenMarland KitchenArmenia Shannon  June 15, 2010 10:11 AM  Levon Hedger  June 19, 2010 3:05 PM Left message on machine for pt to return call to the office.  Will mail letter  Follow-up by: Levon Hedger,  June 19, 2010 3:06 PM

## 2010-08-02 ENCOUNTER — Inpatient Hospital Stay (HOSPITAL_COMMUNITY)
Admission: AD | Admit: 2010-08-02 | Discharge: 2010-08-02 | Disposition: A | Discharge status: Home / Self Care | Payer: Medicaid Other | Source: Ambulatory Visit | Attending: Obstetrics & Gynecology | Admitting: Obstetrics & Gynecology

## 2010-08-02 DIAGNOSIS — O210 Mild hyperemesis gravidarum: Secondary | ICD-10-CM

## 2010-08-02 LAB — URINALYSIS, ROUTINE W REFLEX MICROSCOPIC
Bilirubin Urine: NEGATIVE
Ketones, ur: NEGATIVE mg/dL
Nitrite: NEGATIVE
Urine Glucose, Fasting: NEGATIVE mg/dL
Urobilinogen, UA: 0.2 mg/dL (ref 0.0–1.0)
pH: 5.5 (ref 5.0–8.0)

## 2010-08-02 LAB — URINE MICROSCOPIC-ADD ON

## 2010-08-02 LAB — CBC
HCT: 36.1 % (ref 36.0–46.0)
MCV: 85.5 fL (ref 78.0–100.0)
Platelets: 326 10*3/uL (ref 150–400)

## 2010-08-02 LAB — COMPREHENSIVE METABOLIC PANEL
ALT: 19 U/L (ref 0–35)
Alkaline Phosphatase: 66 U/L (ref 39–117)
BUN: 8 mg/dL (ref 6–23)
Glucose, Bld: 93 mg/dL (ref 70–99)
Potassium: 4.2 mEq/L (ref 3.5–5.1)

## 2010-08-02 LAB — POCT PREGNANCY, URINE: Preg Test, Ur: POSITIVE

## 2010-10-08 LAB — POCT URINALYSIS DIP (DEVICE)
Bilirubin Urine: NEGATIVE
Glucose, UA: NEGATIVE mg/dL
Glucose, UA: 100 mg/dL — AB
Ketones, ur: NEGATIVE mg/dL
Nitrite: NEGATIVE
Protein, ur: 30 mg/dL — AB
Protein, ur: 100 mg/dL — AB
Urobilinogen, UA: 0.2 mg/dL (ref 0.0–1.0)
pH: 5.5 (ref 5.0–8.0)
pH: 5.5 (ref 5.0–8.0)

## 2010-10-08 LAB — RPR: RPR Ser Ql: NONREACTIVE

## 2010-10-08 LAB — CBC
Hemoglobin: 12.5 g/dL (ref 12.0–15.0)
RBC: 4.27 MIL/uL (ref 3.87–5.11)
RDW: 14.7 % (ref 11.5–15.5)
WBC: 12.4 10*3/uL — ABNORMAL HIGH (ref 4.0–10.5)

## 2010-10-09 LAB — CBC
Hemoglobin: 9.4 g/dL — ABNORMAL LOW (ref 12.0–15.0)
MCHC: 33.1 g/dL (ref 30.0–36.0)
MCV: 88.8 fL (ref 78.0–100.0)
RBC: 3.19 MIL/uL — ABNORMAL LOW (ref 3.87–5.11)
WBC: 11.7 10*3/uL — ABNORMAL HIGH (ref 4.0–10.5)

## 2010-11-06 NOTE — Op Note (Signed)
NAMEGERI, HEPLER                ACCOUNT NO.:  [id number]   MEDICAL RECORD NO.:  [id number]          PATIENT TYPE:  INP   LOCATION:  9146                          FACILITY:  [facility]   PHYSICIAN:  Scheryl Darter, MD       DATE OF BIRTH:  29-Sep-1970   DATE OF PROCEDURE:  07/24/2008  DATE OF DISCHARGE:                               OPERATIVE REPORT   PROCEDURE:  Repeat low transverse cesarean section.   PREOPERATIVE DIAGNOSES:  Intrauterine pregnancy, 38 and 1/2-week  gestation, rupture of membranes, and 2 previous cesarean sections.   POSTOPERATIVE DIAGNOSES:  1. Intrauterine pregnancy, delivered.  2. Live born female.  Apgars 9 and 9.  Weight 6 pounds 7 ounces.   SURGEON:  Scheryl Darter, MD   ASSISTANTS:  Myra C. Marice Potter, MD   ANESTHESIA:  Spinal.   ESTIMATED BLOOD LOSS:  800 mL.   IV FLUIDS:  1500 mL crystalloid.   URINE OUTPUT:  200 mL.   COMPLICATIONS:  None.   DRAINS:  Foley catheter.   COUNTS:  Correct.   HOSPITAL COURSE:  The patient gave written consent for repeat cesarean  section 38-1/2-weeks gestation with rupture of membranes.  The patient's  identification was confirmed.  She was brought to OR and spinal  anesthesia was induced.  She was placed in dorsal supine position with a  left lateral tilt.  Foley catheter was placed.  Abdomen was thoroughly  prepped and draped.  A #10 blade was used to make a Pfannenstiel  incision inside her previous cesarean section scar.  Incision was  carried down to the fascia and the fascia was incised transversely with  curved Mayo scissors.  The fascia was separated from underlying tissue  attachments with blunt and sharp dissection.  The peritoneal cavity was  entered and the incision was extended vertically with Mayo scissors.  The bladder blade was inserted.  The uterine segment was exposed.  A #10  blade was used make a transverse incision at midline and the incision  was extended transversely with bandage scissors.  Fetal head  was  elevated and delivered.  Mouth and nose were cleared with bulb suction.  Double nuchal cord was released.  Infant was delivered and was vigorous  at birth.  The cord was clamped and cut and the infant was handed to  nursery personnel and was a live born female born at 66.  Apgars 9 and  9.  Weight 6 pounds 7 ounces.  Placenta was removed from the uterine  cavity.  Uterine cavity was explored.  The patient received IV Pitocin.  She had received IV Kefzol prior to incision.  Bladder blade was re-  inserted.  A 0-Vicryl suture was used to close the incision with a  locking suture.  A second layer followed with 0 Vicryl.  Hemostasis was  obtained with interrupted sutures of Vicryl.  Good hemostasis was  assured.  Both adnexa were inspected and were normal.  The anterior  peritoneum was closed with a running suture of 2-0 Vicryl.  Fascia was  closed with running suture with 0 Vicryl.  Incision was irrigated and  hemostasis was assured with cautery.  Skin incision was closed with a  running subcuticular suture with 4-0 Vicryl.  Sterile dressing was  applied.  The patient tolerated the procedure well without  complications.  She was brought in stable condition to the recovery  room.  Estimated blood loss 800 mL.      Scheryl Darter, MD     JA/MEDQ  D:  07/24/2008  T:  07/25/2008  Job:  [id number]

## 2010-11-06 NOTE — Discharge Summary (Signed)
Nancy [name], Nancy [name]                ACCOUNT NO.:  [id number]   MEDICAL RECORD NO.:  [id number]          PATIENT TYPE:  INP   LOCATION:  9146                          FACILITY:  [facility]   PHYSICIAN:  Scheryl Darter, MD       DATE OF BIRTH:  19-Apr-1971   DATE OF ADMISSION:  07/24/2008  DATE OF DISCHARGE:  07/27/2008                               DISCHARGE SUMMARY   DISCHARGE SUMMARY:  MRN [id number]  DOB 1971/01/06.  Attendging: Dr. Scheryl Darter.   DISCHARGE DIAGNOSES:  1. Repeat low-transverse cesarean section.  2. Asthma.  3. Fibroids.   DISCHARGE MEDICATIONS:  1. Percocet 5/325 mg 1 tablet p.o. q.4 h. p.r.n. pain.  2. Ibuprofen 600 mg 1 tablet p.o. q.6 h. p.r.n. pain.  3. Prenatal vitamin 1 tablet p.o. daily.  4. Colace 100 mg 1 tablet p.o. b.i.d. p.r.n. constipation.  5. Singulair  6. Albuterol 1 puff every 4 hours as needed for wheezing.  7. Flovent 2 puffs b.i.d.  8. Claritin 1 tablet p.o. daily.   CONSULTANTS:  None.   LABORATORY DATA:  1. CBC on July 24, 2008:  WBC 12.4, hemoglobin 12.5, hematocrit      37.7, platelet count 256.  2. CBC on July 25, 2008:  WBC 11.7, hemoglobin 9.4, hematocrit      28.4, platelet count 193.   BRIEF HOSPITAL COURSE:  The patient is a 41 year old G3, P3-0-0-3 who  was admitted on July 24, 2008.  1. Pregnancy: The patient was at 38 and 3 weeks.  The patient had      spontaneous rupture of membranes.  The patient has a history of two      prior C-sections so patient was taken to the operating room for a      repeat low-transverse cesarean section by Dr. Scheryl Darter.  She      delivered a viable female infant.  The procedure was performed in      normal fashion without complications.  Apgars were 9 and 9.  Birth      weight was 6 pounds 7 ounces.  Estimated blood loss was 800 mL.      The patient was given 1500 mL of fluids and a Foley catheter was      placed.  The patient tolerated the procedure well.  For her      postoperative  pain, the patient does have some fibroids which seem      to be contributing to her pain.  The patient had decreased pain      throughout each day.  The patient's incision was clean, dry, and      intact.  The patient's fundus was firm and patient had minimal      bleeding.  On the day of discharge the patient was tolerating      p.o.'s without difficulty, was ambulating, and was agreeable with      discharge.  2. Asthma: After the operation the patient was complaining of some      shortness of breath and some asthma type symptoms.  The patient has  a history of asthma.  The patient was placed on albuterol as well      as on her allergy medicines Singulair and Claritin.  The patient      was noted to have some expiratory wheezes as well as some decreased      air movement in bilateral bases.  Chest x-ray was obtained which      was normal.  The patient was using her albuterol regularly so we      placed the patient on Flovent.  We also gave the patient some      albuterol nebs which seemed to improve her symptoms.  On the day of      discharge, the patient was breathing comfortably on room air      without any increased work of breathing.   DISCHARGE INSTRUCTIONS:  The patient is to have pelvic rest for 6 weeks.  The patient has no restrictions on her diet.  The patient should  increase her activity slowly.  The patient should keep the incision  clean and dry.   FOLLOWUP:  The patient is to follow up with the [facility]  Department in 6 weeks.   DISCHARGE CONDITION:  Stable.      Angelena Sole, MD  Electronically Signed     ______________________________  Scheryl Darter, MD    WS/MEDQ  D:  07/27/2008  T:  07/27/2008  Job:  [id number]

## 2010-11-09 NOTE — Discharge Summary (Signed)
Nancy [name], DRUMMER                ACCOUNT NO.:  [id number]   MEDICAL RECORD NO.:  [id number]          PATIENT TYPE:  INP   LOCATION:  9101                          [facility]ILITY[facility]  [facility]   PHYSICIAN:  Richardean Sale, M.D.   DATE OF BIRTH:  09-Sep-1970   DATE OF ADMISSION:  07/02/2005  DATE OF DISCHARGE:  07/05/2005                                 DISCHARGE SUMMARY   ADMISSION DIAGNOSES:  1.  Intrauterine pregnancy at 38+ week with spontaneous rupture of      membranes.  2.  History of prior cesarean section for failure to progress and multiple      large uterine fibroids for repeat cesarean section.   DISCHARGE DIAGNOSES:  1.  Intrauterine pregnancy at 38+ week with spontaneous rupture of      membranes.  2.  History of prior cesarean section for failure to progress and multiple      large uterine fibroids for repeat cesarean section.  3.  Anemia.   OPERATION/PROCEDURE:  Repeat low transverse cesarean section performed on  July 02, 2005.   OPERATIVE FINDINGS:  Viable female infant, Apgars 9 and 9.  Nuchal cord x3.  Intact placenta with three-vessel cord.  Two large uterine fibroids, each  approximately 10 cm.  Normal-appearing adnexa.   HOSPITAL COURSE AND HISTORY OF PRESENT ILLNESS:  For details, please see  admission history and physical.  Briefly, this is a 40 year old, gravida 2,  para 1 female who was at 38+ weeks gestation with a history of a prior  cesarean section for planned repeat cesarean section who presented on  July 02, 2005, complaining of loss of fluid and was found to have  spontaneous rupture of membranes.  The patient underwent an uncomplicated  repeat cesarean section on July 02, 2005, resulting in delivery of a  viable female infant.  Her postoperative course was unremarkable.  The patient  remained afebrile throughout her hospitalization.  On postoperative day #3,  she was ambulating well, voiding without difficulty.  Was tolerating a  regular diet.  Her pain  was adequately controlled and she was subsequent  discharged to home.   DISPOSITION:  To home.   CONDITION:  Stable.   FOLLOW UP:  The patient will follow up in four weeks for a routine  postoperative visit.  She was given instructions to call for any fever  greater than 100.4, increasing vaginal bleeding, pain not relieved by pain  medications or any drainage or redness around her incision.   MEDICATIONS:  1.  Percocet one to two tablets p.o. q.4-6h. p.r.n. pain.  2.  Ferrous sulfate one p.o. b.i.d. for the patient's anemia.   LABORATORY DATA:  Postoperative day #1, hemoglobin 9, hematocrit 26.5, white  count 11.6, platelet count 203.      Richardean Sale, M.D.  Electronically Signed     JW/MEDQ  D:  07/28/2005  T:  07/29/2005  Job:  [id number]
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
## 2010-11-09 NOTE — Discharge Summary (Signed)
   [name], [name]                            ACCOUNT NO.:  [id number]   MEDICAL RECORD NO.:  [id number]                   PATIENT TYPE:  INP   LOCATION:  9146                                 [facility]   PHYSICIAN:  Jonah Blue, M.D.                DATE OF BIRTH:  30-May-1971   DATE OF ADMISSION:  04/11/2002  DATE OF DISCHARGE:                                 DISCHARGE SUMMARY   PRIMARY PHYSICIAN:  [facility]N Clinic.   DISCHARGE DIAGNOSES:  1. Status post low transverse cesarean section secondary to intrapartum     fever and arrest of descent.  2. Delivery of a viable female infant.  3. Postoperative endometritis.  4. Uterine fibroids.   DISCHARGE MEDICATIONS:  1. Ibuprofen 600 mg p.o. q.6h. p.r.n. pain.  2. Percocet 5/325 mg one to two tablets p.o. q.4-6h. p.r.n. severe pain.  3. Ampicillin 500 mg p.o. q.6h. x6 days.  4. Depo-Provera 150 mg IM every three months.  5. Prenatal vitamins one p.o. q.d. x6 weeks or while breast feeding.  6. Iron sulfate 325 mg p.o. t.i.d. with meals.  7. Stool softener of choice p.r.n. constipation.   FOLLOW-UP:  The patient is to follow up at the [facility] on May 25, 2002 at 1 p.m.   ADMISSION HISTORY AND PHYSICAL:  A 40 year old G1 P0 at 18 and two weeks  gestation admitted in labor.  The patient had SROM in MAU and UCs every five  minutes prior to admission.  Pregnancy complicated by myomatous uterus,  labial HSV, and anemia.   HOSPITAL COURSE:  The patient was admitted in early labor and progressed  under epidural anesthesia, eventually developing protracted labor with  adequate contractions and intrapartum fever eventually requiring low  transverse C section performed by Dr. Orlene Erm. A viable female infant was  delivered on April 11, 2002 at 2122 with Apgars of 6 and 9.  The patient  did have postoperative fever requiring a slightly prolonged hospital stay  which eventually resolved and was believed to be secondary to  endometritis.  The patient will continue a 10-day course of p.o. antibiotics, having  received four days of IV antibiotics in the hospital.  She developed no new  concerns throughout her hospitalization.                                               Jonah Blue, M.D.    Milas Gain  D:  04/15/2002  T:  04/15/2002  Job:  [id number]   cc:   [facility]N Clinic

## 2010-11-09 NOTE — Discharge Summary (Signed)
NAME:  Nancy [name], Nancy [name]                            ACCOUNT NO.:  [id number]   MEDICAL RECORD NO.:  [id number]                   PATIENT TYPE:  INP   LOCATION:  9135                                 [facility]   PHYSICIAN:  Tanya S. Shawnie Pons, M.D.                DATE OF BIRTH:  1970/08/14   DATE OF ADMISSION:  04/26/2002  DATE OF DISCHARGE:  04/28/2002                                 DISCHARGE SUMMARY   DISCHARGE DIAGNOSES:  1. Endometritis status post intravenous antibiotic therapy.  2. Two weeks postoperative low transverse cesarean section.  3. Fibroids.  4. History of abnormal Pap.   DISCHARGE MEDICATIONS:  1. Azithromycin 350 mg one p.o. q.d. x7 days.  2. Cleocin 450 mg one p.o. q.6h. x7 days.   DISCHARGE INSTRUCTIONS:  Activity, diet, and wound care as previously for  routine postpartum/post cesarean section care.  The patient was instructed  to return sooner if she were to have fevers or increase in abdominal pain or  discharge or erythema around the incision site or other symptoms that were  to concern her.   FOLLOW UP:  The patient is to return to the maternity admissions unit at  [facility] in six to seven days for follow-up.  Recommended she return  on November 11 between the times of 12 noon and 5 p.m. so that I will be  able to see her then.   HOSPITAL COURSE:  The patient is a G1, P1 who presented to the MAU with  complaint of persistent abdominal pain and fever to 102 on day of admission.  She had been discharged from [facility] on October 23 following a low  transverse cesarean section delivery.  At that hospitalization she was  treated with IV gentamicin, ampicillin, and doxycycline for 48 hours  postoperative for endometritis.  In addition, she completed a six day course  of p.o. antibiotics following discharge.   PAST MEDICAL HISTORY:  Significant for being status post LTCS at 41 weeks,  fibroids, and seasonal allergies and asthma.   PHYSICAL  EXAMINATION:  VITAL SIGNS:  Temperature 102, pulse 110, blood  pressure 109/70.  PELVIC:  Enlarged, firm uterus that had diffuse tenderness on palpation, but  worse on the right lower quadrant.  Incision site was clean, dry, and intact  without erythema or drainage.   LABORATORIES:  Initial laboratories are notable for a white blood cell count  of 6.8 and elevated platelets 804,000.   The patient was admitted with a presumed diagnosis of late endometritis  versus abscess.  She underwent a CT of the abdomen and pelvis on the morning  of November 4 which revealed multiple large fibroids, likely degenerating.  No abscess or free fluid.  Mild right hydronephrosis which was likely  secondary to uterus and fibroids.  Please note a urinalysis on admission was  within normal limits.   At time of  admission patient was immediately started on IV  ampicillin/gentamicin/clindamycin.  Throughout the hospital course she  remained afebrile with a Tmax of 99.0 and her abdominal tenderness  decreased.  However, was noted to have tenderness on the right side more  than the left.  At the time of discharge blood cultures were negative x2.   At the time of discharge patient had been afebrile for almost 48 hours with  decreased pain.  She was discharged on p.o. antibiotics as above and  recommended to follow up also as above.     Douglass Rivers, M.D.                      Shelbie Proctor. Shawnie Pons, M.D.    CH/MEDQ  D:  04/28/2002  T:  04/29/2002  Job:  [id number]

## 2010-11-09 NOTE — Op Note (Signed)
Nancy [name], Nancy [name]                            ACCOUNT NO.:  [id number]   MEDICAL RECORD NO.:  [id number]                   PATIENT TYPE:  INP   LOCATION:  9146                                 [facility]   PHYSICIAN:  Mary Sella. Orlene Erm, M.D.                 DATE OF BIRTH:  March 07, 1971   DATE OF PROCEDURE:  04/11/2002  DATE OF DISCHARGE:                                 OPERATIVE REPORT   PREOPERATIVE DIAGNOSES:  1. This 40 year old gravida 1 female at term.  2. Nonreassuring fetal heart tracing.  3. Arrest of decent.  4. Uterine leiomyomata.  5. Thick meconium.   POSTOPERATIVE DIAGNOSES:  1. This 40 year old gravida 1 female at term.  2. Nonreassuring fetal heart tracing.  3. Arrest of decent.  4. Uterine leiomyomata.  5. Thick meconium.   PROCEDURE:  Primary low transverse cesarean section.   SURGEON:  Enid Cutter, M.D.   ASSISTANT:  Dr. Samara Deist   ANESTHESIA:  Epidural.   COMPLICATIONS:  None.   SPECIMENS:  Cord blood, cord gases, and placenta.   ESTIMATED BLOOD LOSS:  1000 cc.   FINDINGS:  Viable female infant delivered at 2122 with Apgars of 6 and 9  weighing 6 pounds and 2 ounces.   INDICATIONS FOR PROCEDURE:  The patient is a 40 year old gravida 1 black  female who presented with spontaneous rupture of membranes early in the  morning of April 11, 2002.  The patient was admitted to Labor and Delivery  and begun on Pitocin.  She progressed slowly to 4 cm.  An IUPC was placed  and the patient was thought to be adequate labor.  She continued to progress  slowly to approximately 6 cm dilatation.  She had arrest of labor for a  period of two hours.  At the time, the patient was afebrile and the fetal  heart rate tracing was reassuring.  The patient desired strongly to continue  with attempted vaginal delivery and the decision was made to allow her to  progress in labor.  The patient began developing a fever antepartum and was  started ampicillin and gentamicin  IV.  She was given Tylenol.  There was  increased fetal heart rate baseline with decrease in variability.  There was  repetitive variable decelerations.  The patient was counseled on the  nonreassuring status of the fetus.  She, however, elected to continue  attempt labor.  The patient progressed slowly to complete at approximately  1900 and began pushing.  The patient pushed for approximately an hour and  had no descent of the fetal presenting part.  Complete OA presentation was  some molding and mild caput.  At that time, the patient was counseled on the  need to progress with cesarean.  She elected to continue pushing.  The  patient continued pushing for a second hour with no further descent of the  fetal head.  The patient did at that time agree to proceed with a transverse  cesarean section.  The patient was counseled on the risk of bleeding,  infection, injury to internal organs.  The patient was counseled on the risk  of transfusion or emergent hysterectomy secondary to uncontrollable  hemorrhage.  The patient understands these risks and wishes to proceed.   PROCEDURE:  The patient was taken to the operating room where her epidural  anesthesia was bolused.  She was prepped and draped in sterile fashion.  A  Pfannenstiel incision was performed with a scalpel and carried down to the  underlying fascia.  The fascia was then dissected lateral with Mayo  scissors.  The fascia was separated from the underlying rectus muscle  bellies with sharp and blunt dissection.  The rectus muscle bellies were  separated and the underlying peritoneum was entered sharply.  The surgeon's  fingers were used to stretch the peritoneum and a bladder blade was placed.  A bladder flap was created with sharp and blunt dissection.  The uterus was  scored with a scalpel and the incision was carried down to the midline.  The  surgeon's fingers were used to extend the incision and the infant's head was  delivered  through the uterine incision.  The infant was DeLee suctioned on  the operative field.  The infant's body was delivered and the cord was  clamped and cut.  There was a true knot in the umbilical cord that was  tight.  The infant was handed off to the waiting neonatal resuscitation  team.  The cord gases were attempted to be drawn below the knot but there  was no return of blood.  At that point, the cord gases were drawn above the  knot in the cord.  The placenta was then manually extracted and the uterus  was curetted with a dry lap sponge.  The uterus was kept intra-abdominally  secondary to the large uterine leiomyomata.  The uterine incision was  repaired with a running, locking stitch of 0 chromic.  The second  imbricating layer was performed to obtain the final hemostasis.  The uterine  incision was inspected and found to be hemostatic.  The paracolonic gutters  were emptied of clot and debris and the pelvis was irrigated with copious  amounts of saline.  The fascia was then closed with a running stitch of 1  Vicryl.  The subcutaneous tissues were irrigated with copious amounts of  saline and the skin edges were reapproximated with staples.  The patient  tolerated the procedure well and returned to the recovery room in stable  condition.                                                Mary Sella. Orlene Erm, M.D.    EMH/MEDQ  D:  04/11/2002  T:  04/11/2002  Job:  [id number]

## 2010-11-09 NOTE — Op Note (Signed)
NAMESKYLEY, GRANDMAISON                ACCOUNT NO.:  [id number]   MEDICAL RECORD NO.:  [id number]          PATIENT TYPE:  INP   LOCATION:  9199                          [facility]   PHYSICIAN:  Richardean Sale, M.D.   DATE OF BIRTH:  Sep 02, 1970   DATE OF PROCEDURE:  07/02/2005  DATE OF DISCHARGE:                                 OPERATIVE REPORT   PREOPERATIVE DIAGNOSES:  Thirty-eight plus week intrauterine pregnancy with  history of prior cesarean section for failure to progress, now with  spontaneous rupture of membranes, also with large known large uterine  fibroid.   POSTOPERATIVE DIAGNOSES:  Thirty-eight plus week intrauterine pregnancy with  history of prior cesarean section for failure to progress, now with  spontaneous rupture of membranes, also with large known large uterine  fibroid.   PROCEDURE:  Repeat low transverse cesarean section.   SURGEON:  Richardean Sale, M.D.   ASSISTANT:  Cordelia Pen A. Rosalio Macadamia, M.D.   ANESTHESIA:  Spinal.   COMPLICATIONS:  None.   ESTIMATED BLOOD LOSS:  1000 mL.   URINE OUTPUT:  350 mL clear.   SPECIMEN:  Placenta to pathology.   OPERATIVE FINDINGS:  Viable female infant in the cephalic presentation with  Apgars of 9 and 9 and a nuchal cord x2.  Intact placenta with three-vessel  cord, sent to pathology.  A low-lying placenta.  Clear amniotic fluid.  Normal ovaries bilaterally.  At least two large fibroids, the largest of  which is approximately 10 cm, from the posterior aspect of the fundus.  No  evidence of any submucosal component.   INDICATIONS:  This is a 40 year old gravida 2, para 1, African female who is  at 76+ weeks' gestation with due date of July 11, 2005, who has had a  prior cesarean section for failure to deliver after three hours of pushing.  The patient complained of spontaneous rupture of membranes beginning on the  evening of July 01, 2005.  She presented to the office this morning, and  sterile speculum exam  confirmed gross rupture of membranes.  The patient was  contracting irregularly, reports good movement, and denied any bright red  vaginal bleeding.  A planned repeat cesarean section was already scheduled  for this July 05, 2005.  Prior to the procedure the risks, benefits and  alternatives of the procedure were reviewed with the patient in detail.  We  discussed the risks, which include but are not limited to hemorrhage  requiring transfusion, infection, injury to the bowel, the bladder, the  ureter or other organs, which could require additional surgery either at the  time of  this procedure or in the future.  We reviewed the possibility of  intra-abdominal scarring and anesthesia-related complications.  Also,  specifically to this case, given that the patient had a placenta previa in  this pregnancy that had resolved and the placenta was almost low-lying, as  well as the patient has multiple uterine fibroids, she was counseled on the  possibility of proceeding with emergent hysterectomy at the time of this  procedure.  The patient voiced understanding of all  these risks and agreed  to proceed.  Informed consent was obtained before proceeding to the OR.   PROCEDURE:  The patient was taken to the operating room, where she was given  a spinal anesthetic.  She was then prepped and draped in the usual sterile  fashion and placed in the supine position with a leftward tilt.  A Foley  catheter was placed, 10 mL of 0.25% plain Marcaine were then injected into  the area where the skin incision was to be made.  A Pfannenstiel skin  incision was then made through the patient's prior C-section incision.  This  was carried down to the fascia.  The fascia was then incised in midline and  the fascial incision was then extended with the Mayo scissors.  The fascia  was then grasped between two Kocher clamps, elevated, and the underlying  rectus muscles were dissected off with both sharp and blunt  dissection.  The  muscles were then separated in the midline.  The peritoneum was adherent to  the muscles and was entered at this time atraumatically.  The peritoneal  incision was then extended superiorly and inferiorly, and there was visual  visualization of the bladder.  The bladder blade was then inserted.  There  was some scarring of the bladder to the lower uterine segment, and there  were very large veins noted to be coursing through the lower uterine  segment.  The bladder flap was then very carefully created with sharp  dissection and once the bladder was displaced adequately downward, the  uterine incision was made in a low transverse fashion with the scalpel.  Upon entry into the uterus, there was scant amniotic fluid noted that was  clear, consistent with ruptured membranes.  The uterine incision was then  extended with the bandage scissors.  The vacuum was used to facilitate  delivery of the fetal vertex given the large uterine fibroid and the boggy  consistency of the uterus.  The head was delivered atraumatically.  The  vacuum was disconnected.  Nuchal cord x3 was reduced.  The infant was then  delivered to the sterile field, was bulb-suctioned.  The cord was clamped  and cut and the infant was handed off to the waiting NICU attendants.  The  cord gas and cord blood was obtained.  Apgars were 9/9.  The placenta was  then removed intact and was sent to pathology.  The uterine cavity was then  cleared with a moist laparotomy sponge, and there was no evidence of any  retained placental tissue.  The uterine incision was then repaired with 1-0  chromic in a running locked fashion and a second layer of the same suture  was placed in an imbricating fashion for additional hemostasis.  Additional  figure-of-eight sutures were placed in the uterine incision and once  hemostasis was confirmed, the adnexa were visualized and were normal.  The uterus was inspected.  It was still at 24  weeks' size, given the multiple  fibroids.  The pelvis was then irrigated copiously with warm normal saline.  The uterine incision was hemostatic.  The peritoneal and muscular surfaces  were inspected and any areas of bleeding were cauterized with the Bovie.  The fascia was then closed with a running Vicryl suture.  The subcutaneous  space was irrigated and any areas of bleeding were cauterized with the Bovie  and the skin was closed with staples.   The patient tolerated the procedure very well.  All sponge, lap,  needle and  instrument counts were correct x2.  She was taken to the recovery room awake  and in stable condition.  There are no complications.      Richardean Sale, M.D.  Electronically Signed     JW/MEDQ  D:  07/02/2005  T:  07/02/2005  Job:  [id number]

## 2011-01-08 ENCOUNTER — Other Ambulatory Visit (HOSPITAL_COMMUNITY): Payer: Self-pay | Admitting: Obstetrics and Gynecology

## 2011-01-08 DIAGNOSIS — O2690 Pregnancy related conditions, unspecified, unspecified trimester: Secondary | ICD-10-CM

## 2011-01-15 ENCOUNTER — Encounter (HOSPITAL_COMMUNITY): Payer: Self-pay

## 2011-01-15 ENCOUNTER — Ambulatory Visit (HOSPITAL_COMMUNITY)
Admission: RE | Admit: 2011-01-15 | Discharge: 2011-01-15 | Disposition: A | Discharge status: Home / Self Care | Payer: Medicaid Other | Source: Ambulatory Visit | Attending: Obstetrics and Gynecology | Admitting: Obstetrics and Gynecology

## 2011-01-15 DIAGNOSIS — O4400 Placenta previa specified as without hemorrhage, unspecified trimester: Secondary | ICD-10-CM | POA: Insufficient documentation

## 2011-01-15 DIAGNOSIS — O99810 Abnormal glucose complicating pregnancy: Secondary | ICD-10-CM | POA: Insufficient documentation

## 2011-01-15 DIAGNOSIS — Z363 Encounter for antenatal screening for malformations: Secondary | ICD-10-CM | POA: Insufficient documentation

## 2011-01-15 DIAGNOSIS — Z1389 Encounter for screening for other disorder: Secondary | ICD-10-CM | POA: Insufficient documentation

## 2011-01-15 DIAGNOSIS — O358XX0 Maternal care for other (suspected) fetal abnormality and damage, not applicable or unspecified: Secondary | ICD-10-CM | POA: Insufficient documentation

## 2011-01-15 DIAGNOSIS — O34219 Maternal care for unspecified type scar from previous cesarean delivery: Secondary | ICD-10-CM | POA: Insufficient documentation

## 2011-01-15 DIAGNOSIS — O2690 Pregnancy related conditions, unspecified, unspecified trimester: Secondary | ICD-10-CM

## 2011-01-15 DIAGNOSIS — O3410 Maternal care for benign tumor of corpus uteri, unspecified trimester: Secondary | ICD-10-CM | POA: Insufficient documentation

## 2011-01-15 DIAGNOSIS — O09529 Supervision of elderly multigravida, unspecified trimester: Secondary | ICD-10-CM | POA: Insufficient documentation

## 2011-01-16 ENCOUNTER — Encounter (HOSPITAL_COMMUNITY): Payer: Self-pay

## 2011-01-18 ENCOUNTER — Other Ambulatory Visit (HOSPITAL_COMMUNITY): Payer: Self-pay | Admitting: Obstetrics and Gynecology

## 2011-01-18 DIAGNOSIS — O24919 Unspecified diabetes mellitus in pregnancy, unspecified trimester: Secondary | ICD-10-CM

## 2011-01-24 ENCOUNTER — Ambulatory Visit (HOSPITAL_COMMUNITY): Payer: Medicaid Other

## 2011-02-01 ENCOUNTER — Ambulatory Visit (HOSPITAL_COMMUNITY): Payer: Medicaid Other

## 2011-02-01 ENCOUNTER — Encounter (HOSPITAL_COMMUNITY): Payer: Self-pay

## 2011-02-01 ENCOUNTER — Ambulatory Visit (HOSPITAL_COMMUNITY)
Admission: RE | Admit: 2011-02-01 | Discharge: 2011-02-01 | Disposition: A | Discharge status: Home / Self Care | Payer: Medicaid Other | Source: Ambulatory Visit | Attending: Obstetrics and Gynecology | Admitting: Obstetrics and Gynecology

## 2011-02-01 VITALS — BP 118/67 | HR 90 | Wt 189.0 lb

## 2011-02-01 DIAGNOSIS — O99810 Abnormal glucose complicating pregnancy: Secondary | ICD-10-CM | POA: Insufficient documentation

## 2011-02-01 DIAGNOSIS — O09529 Supervision of elderly multigravida, unspecified trimester: Secondary | ICD-10-CM | POA: Insufficient documentation

## 2011-02-01 DIAGNOSIS — O4400 Placenta previa specified as without hemorrhage, unspecified trimester: Secondary | ICD-10-CM | POA: Insufficient documentation

## 2011-02-01 DIAGNOSIS — O24919 Unspecified diabetes mellitus in pregnancy, unspecified trimester: Secondary | ICD-10-CM

## 2011-02-01 DIAGNOSIS — O34219 Maternal care for unspecified type scar from previous cesarean delivery: Secondary | ICD-10-CM | POA: Insufficient documentation

## 2011-02-01 DIAGNOSIS — O3410 Maternal care for benign tumor of corpus uteri, unspecified trimester: Secondary | ICD-10-CM | POA: Insufficient documentation

## 2011-02-01 NOTE — Progress Notes (Signed)
Report in [facility]; office appointment in one week at [facility].  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

## 2011-02-06 ENCOUNTER — Encounter: Payer: Medicaid Other | Attending: Obstetrics and Gynecology

## 2011-02-06 ENCOUNTER — Ambulatory Visit: Payer: Medicaid Other

## 2011-02-06 DIAGNOSIS — O99810 Abnormal glucose complicating pregnancy: Secondary | ICD-10-CM | POA: Insufficient documentation

## 2011-02-06 DIAGNOSIS — Z713 Dietary counseling and surveillance: Secondary | ICD-10-CM | POA: Insufficient documentation

## 2011-02-07 ENCOUNTER — Ambulatory Visit (HOSPITAL_COMMUNITY)
Admission: RE | Admit: 2011-02-07 | Discharge: 2011-02-07 | Disposition: A | Discharge status: Home / Self Care | Payer: Medicaid Other | Source: Ambulatory Visit | Attending: Obstetrics and Gynecology | Admitting: Obstetrics and Gynecology

## 2011-02-07 DIAGNOSIS — O99810 Abnormal glucose complicating pregnancy: Secondary | ICD-10-CM | POA: Insufficient documentation

## 2011-02-07 DIAGNOSIS — O09529 Supervision of elderly multigravida, unspecified trimester: Secondary | ICD-10-CM | POA: Insufficient documentation

## 2011-02-07 DIAGNOSIS — O4400 Placenta previa specified as without hemorrhage, unspecified trimester: Secondary | ICD-10-CM | POA: Insufficient documentation

## 2011-02-07 DIAGNOSIS — O34219 Maternal care for unspecified type scar from previous cesarean delivery: Secondary | ICD-10-CM | POA: Insufficient documentation

## 2011-02-07 NOTE — Progress Notes (Signed)
I spent about 15 minutes reviewing the MRI results with Nancy [name]. The MRI showed a focal bulging within the lower anterior placenta along with concomitant myometrial thinning. Findings could be c/w placental accreta but there was no evidence of placenta increta or percreta. No previa identified and several fibroids were visualized. We also began planning for her delivery at [facility]. She was very interested in knowing the date and time so her husband could be with her. I encouraged her to continue her care with [facility]. I will contact her with more information after discussing case with colleagues.

## 2011-02-12 NOTE — Progress Notes (Signed)
  Patient was seen on 02/06/2011 for Gestational Diabetes self-management class at the [facility]. The following learning objectives were met by the patient during this course:   States the definition of Gestational Diabetes  States why dietary management is important in controlling blood glucose  Describes the effects each nutrient has on blood glucose levels  Demonstrates ability to create a balanced meal plan  Demonstrates carbohydrate counting   States when to check blood glucose levels  Demonstrates proper blood glucose monitoring techniques  States the effect of stress and exercise on blood glucose levels  States the importance of limiting caffeine and abstaining from alcohol and smoking  Blood glucose monitor given: Accu Chek Nano SmartView Meter Kit Lot # [id number] Exp: 05/24/2011 Blood glucose reading: 103  Patient instructed to monitor glucose levels:Fasting and 1 hours after the first bite of each meal FBS: 60 - <90 1 hour after meal: 140 or less  Patient will be seen for follow-up as needed.

## 2011-03-25 LAB — DIFFERENTIAL
Basophils Absolute: 0
Eosinophils Relative: 1
Lymphocytes Relative: 18
Monocytes Absolute: 0.7
Monocytes Relative: 11

## 2011-03-25 LAB — BASIC METABOLIC PANEL
BUN: 6
CO2: 23
Calcium: 8.6
GFR calc non Af Amer: >60
Potassium: 3.1 — ABNORMAL LOW
Sodium: 133 — ABNORMAL LOW

## 2011-03-25 LAB — CBC: WBC: 6.7

## 2011-03-28 LAB — POCT URINALYSIS DIP (DEVICE)
Hgb urine dipstick: NEGATIVE
Hgb urine dipstick: NEGATIVE
Nitrite: NEGATIVE
Protein, ur: 100 mg/dL — AB
Protein, ur: 30 mg/dL — AB
Specific Gravity, Urine: 1.02 (ref 1.005–1.030)
Urobilinogen, UA: 0.2 mg/dL (ref 0.0–1.0)
Urobilinogen, UA: 0.2 mg/dL (ref 0.0–1.0)
pH: 5.5 (ref 5.0–8.0)
pH: 6.5 (ref 5.0–8.0)

## 2011-04-02 ENCOUNTER — Other Ambulatory Visit: Payer: Self-pay | Admitting: Internal Medicine

## 2011-04-02 ENCOUNTER — Ambulatory Visit
Admission: RE | Admit: 2011-04-02 | Discharge: 2011-04-02 | Disposition: A | Discharge status: Home / Self Care | Payer: Medicaid Other | Source: Ambulatory Visit | Attending: Internal Medicine | Admitting: Internal Medicine

## 2011-04-02 DIAGNOSIS — E049 Nontoxic goiter, unspecified: Secondary | ICD-10-CM

## 2011-04-04 ENCOUNTER — Other Ambulatory Visit: Payer: Self-pay | Admitting: Internal Medicine

## 2011-04-04 DIAGNOSIS — E042 Nontoxic multinodular goiter: Secondary | ICD-10-CM

## 2011-04-09 ENCOUNTER — Ambulatory Visit
Admission: RE | Admit: 2011-04-09 | Discharge: 2011-04-09 | Disposition: A | Discharge status: Home / Self Care | Payer: Medicaid Other | Source: Ambulatory Visit | Attending: Internal Medicine | Admitting: Internal Medicine

## 2011-04-09 ENCOUNTER — Encounter (HOSPITAL_COMMUNITY): Payer: Self-pay

## 2011-04-09 ENCOUNTER — Other Ambulatory Visit (HOSPITAL_COMMUNITY)
Admission: RE | Admit: 2011-04-09 | Discharge: 2011-04-09 | Disposition: A | Discharge status: Home / Self Care | Payer: Medicaid Other | Source: Ambulatory Visit | Attending: Diagnostic Radiology | Admitting: Diagnostic Radiology

## 2011-04-09 DIAGNOSIS — E042 Nontoxic multinodular goiter: Secondary | ICD-10-CM

## 2011-04-09 DIAGNOSIS — E049 Nontoxic goiter, unspecified: Secondary | ICD-10-CM | POA: Insufficient documentation

## 2012-04-06 ENCOUNTER — Other Ambulatory Visit: Payer: Self-pay | Admitting: Internal Medicine

## 2012-04-06 DIAGNOSIS — E049 Nontoxic goiter, unspecified: Secondary | ICD-10-CM

## 2012-04-15 ENCOUNTER — Other Ambulatory Visit: Payer: Medicaid Other

## 2012-07-27 ENCOUNTER — Emergency Department (HOSPITAL_COMMUNITY)
Admission: EM | Admit: 2012-07-27 | Discharge: 2012-07-27 | Disposition: A | Discharge status: Home / Self Care | Payer: Self-pay | Source: Home / Self Care | Attending: Family Medicine | Admitting: Family Medicine

## 2012-07-27 ENCOUNTER — Encounter (HOSPITAL_COMMUNITY): Payer: Self-pay

## 2012-07-27 DIAGNOSIS — J329 Chronic sinusitis, unspecified: Secondary | ICD-10-CM

## 2012-07-27 DIAGNOSIS — Z20818 Contact with and (suspected) exposure to other bacterial communicable diseases: Secondary | ICD-10-CM

## 2012-07-27 DIAGNOSIS — J020 Streptococcal pharyngitis: Secondary | ICD-10-CM

## 2012-07-27 DIAGNOSIS — E860 Dehydration: Secondary | ICD-10-CM

## 2012-07-27 MED ORDER — AMOXICILLIN 875 MG PO TABS: 875.0000 mg | ORAL_TABLET | Freq: Two times a day (BID) | ORAL | Status: DC

## 2012-07-27 MED ORDER — IBUPROFEN 800 MG PO TABS: 800.0000 mg | ORAL_TABLET | Freq: Three times a day (TID) | ORAL | Status: DC | PRN

## 2012-07-27 MED ORDER — AMOXICILLIN 875 MG PO TABS: 875.0000 mg | ORAL_TABLET | Freq: Two times a day (BID) | ORAL | Status: AC

## 2012-07-27 MED ORDER — IBUPROFEN 800 MG PO TABS: 800.0000 mg | ORAL_TABLET | Freq: Three times a day (TID) | ORAL | Status: AC | PRN

## 2012-07-27 MED ORDER — LORATADINE 10 MG PO TABS: 10.0000 mg | ORAL_TABLET | ORAL | Status: AC | PRN

## 2012-07-27 NOTE — ED Provider Notes (Signed)
History     CSN: [id number]  Arrival date & time 07/27/12  1627   First MD Initiated Contact with Patient 07/27/12 1839      Chief Complaint  Patient presents with  . Influenza    (Consider location/radiation/quality/duration/timing/severity/associated sxs/prior treatment) HPI Pt reports that she has been sick for the last week. The symptoms have gotten worse in last day.  She developed sore throat and chills and body aches. The patient says that she not having cough, pt is having sinus pain and pressure, no sinus drainage but sore throat and hurts to swallow.  The patient has been having chills.   Some SOB.  No headache.  Pt has had malaise.   Pt says that her husband has had strep throat.    History reviewed. No pertinent past medical history.  History reviewed. No pertinent past surgical history.  No family history on file.  History  Substance Use Topics  . Smoking status: Not on file  . Smokeless tobacco: Not on file  . Alcohol Use: Not on file    OB History    Grav Para Term Preterm Abortions TAB SAB Ect Mult Living   4 3 3  0 0 0 0 0 0 3      Review of Systems  Constitutional: Positive for fever and fatigue.  HENT: Positive for hearing loss, ear pain, congestion, sore throat, rhinorrhea, postnasal drip and sinus pressure.   Neurological: Positive for weakness.    Allergies  Review of patient's allergies indicates no known allergies.  Home Medications   Current Outpatient Rx  Name  Route  Sig  Dispense  Refill  . LORATADINE 10 MG PO TABS   Oral   Take 10 mg by mouth as needed.           Marland Kitchen PRENATAL VITAMINS PO   Oral   Take by mouth.             BP 133/62  Pulse 117  Temp 98.3 F (36.8 C) (Oral)  Resp 15  SpO2 100%  Physical Exam  Nursing note and vitals reviewed. Constitutional: She is oriented to person, place, and time. She appears well-developed and well-nourished. No distress.  HENT:  Head: Normocephalic and atraumatic.  Right Ear:  External ear normal.  Left Ear: External ear normal.  Nose: Mucosal edema and rhinorrhea present. Right sinus exhibits maxillary sinus tenderness. Left sinus exhibits maxillary sinus tenderness.  Mouth/Throat: Posterior oropharyngeal edema and posterior oropharyngeal erythema present. No tonsillar abscesses.       Swollen beefy red tonsils-kissing  Neck: Normal range of motion. Neck supple. No thyromegaly present.  Cardiovascular: Normal rate, regular rhythm and normal heart sounds.   Pulmonary/Chest: Effort normal and breath sounds normal. No respiratory distress. She has no wheezes.  Abdominal: Soft. Bowel sounds are normal.  Musculoskeletal: Normal range of motion. She exhibits no edema.  Lymphadenopathy:    She has cervical adenopathy.  Neurological: She is alert and oriented to person, place, and time. No cranial nerve deficit.  Skin: Skin is warm and dry.  Psychiatric: She has a normal mood and affect. Her behavior is normal. Judgment and thought content normal.    ED Course  Procedures (including critical care time)  Labs Reviewed - No data to display No results found.   No diagnosis found.   MDM  IMPRESSION  Pharyngitis  Sinusitis  Dehydration  Strep throat  Allergic rhinitis  RECOMMENDATIONS / PLAN Empirically start amoxicillin 875 mg by mouth twice a  day, ibuprofen 800 mg by mouth every 8 hours around the clock, encourage fluids, rest Return to clinic if no improvement over the next 3 days or worsening symptoms-patient verbalized understanding  FOLLOW UP If no improvement after 3 days or worsening symptoms  The patient was given clear instructions to go to ER or return to medical center if symptoms don't improve, worsen or new problems develop.  The patient verbalized understanding.  The patient was told to call to get lab results if they haven't heard anything in the next week.            Cleora Fleet, MD 07/27/12 2004

## 2012-07-27 NOTE — ED Notes (Signed)
Patient states has chills congestion body aches muscle aches for a week

## 2013-03-05 ENCOUNTER — Other Ambulatory Visit: Payer: Self-pay | Admitting: Family Medicine

## 2013-03-05 ENCOUNTER — Other Ambulatory Visit: Payer: Self-pay

## 2013-03-05 DIAGNOSIS — Z1231 Encounter for screening mammogram for malignant neoplasm of breast: Secondary | ICD-10-CM

## 2013-03-11 ENCOUNTER — Ambulatory Visit: Payer: Medicaid Other

## 2014-04-25 ENCOUNTER — Encounter (HOSPITAL_COMMUNITY): Payer: Self-pay

## 2014-11-07 ENCOUNTER — Other Ambulatory Visit (HOSPITAL_COMMUNITY)
Admission: RE | Admit: 2014-11-07 | Discharge: 2014-11-07 | Disposition: A | Discharge status: Home / Self Care | Payer: Managed Care, Other (non HMO) | Source: Ambulatory Visit | Attending: Family Medicine | Admitting: Family Medicine

## 2014-11-07 ENCOUNTER — Other Ambulatory Visit: Payer: Self-pay | Admitting: Physician Assistant

## 2014-11-07 DIAGNOSIS — Z124 Encounter for screening for malignant neoplasm of cervix: Secondary | ICD-10-CM | POA: Diagnosis present

## 2014-11-08 LAB — CYTOLOGY - PAP

## 2014-12-23 ENCOUNTER — Other Ambulatory Visit: Payer: Self-pay | Admitting: Internal Medicine

## 2014-12-23 DIAGNOSIS — E049 Nontoxic goiter, unspecified: Secondary | ICD-10-CM

## 2014-12-30 ENCOUNTER — Ambulatory Visit
Admission: RE | Admit: 2014-12-30 | Discharge: 2014-12-30 | Disposition: A | Discharge status: Home / Self Care | Payer: Managed Care, Other (non HMO) | Source: Ambulatory Visit | Attending: Internal Medicine | Admitting: Internal Medicine

## 2014-12-30 ENCOUNTER — Encounter (INDEPENDENT_AMBULATORY_CARE_PROVIDER_SITE_OTHER): Payer: Self-pay

## 2014-12-30 DIAGNOSIS — E049 Nontoxic goiter, unspecified: Secondary | ICD-10-CM

## 2015-01-06 ENCOUNTER — Other Ambulatory Visit: Payer: Self-pay | Admitting: Internal Medicine

## 2015-01-06 DIAGNOSIS — E049 Nontoxic goiter, unspecified: Secondary | ICD-10-CM

## 2015-01-25 ENCOUNTER — Other Ambulatory Visit (HOSPITAL_COMMUNITY)
Admission: RE | Admit: 2015-01-25 | Discharge: 2015-01-25 | Disposition: A | Discharge status: Home / Self Care | Payer: Managed Care, Other (non HMO) | Source: Ambulatory Visit | Attending: Interventional Radiology | Admitting: Interventional Radiology

## 2015-01-25 ENCOUNTER — Ambulatory Visit
Admission: RE | Admit: 2015-01-25 | Discharge: 2015-01-25 | Disposition: A | Discharge status: Home / Self Care | Payer: Managed Care, Other (non HMO) | Source: Ambulatory Visit | Attending: Internal Medicine | Admitting: Internal Medicine

## 2015-01-25 DIAGNOSIS — E041 Nontoxic single thyroid nodule: Secondary | ICD-10-CM | POA: Diagnosis not present

## 2015-01-25 DIAGNOSIS — E049 Nontoxic goiter, unspecified: Secondary | ICD-10-CM

## 2015-12-22 ENCOUNTER — Other Ambulatory Visit: Payer: Self-pay | Admitting: Internal Medicine

## 2015-12-22 DIAGNOSIS — E042 Nontoxic multinodular goiter: Secondary | ICD-10-CM

## 2015-12-29 ENCOUNTER — Other Ambulatory Visit: Payer: Managed Care, Other (non HMO)

## 2016-03-28 ENCOUNTER — Ambulatory Visit
Admission: RE | Admit: 2016-03-28 | Discharge: 2016-03-28 | Disposition: A | Discharge status: Home / Self Care | Payer: Managed Care, Other (non HMO) | Source: Ambulatory Visit | Attending: Internal Medicine | Admitting: Internal Medicine

## 2016-03-28 DIAGNOSIS — E042 Nontoxic multinodular goiter: Secondary | ICD-10-CM

## 2017-02-08 IMAGING — US US SOFT TISSUE HEAD/NECK
1 series · 14 of 25 positions shown · non-contrast
Comparison: 04/09/2011 and earlier studies

CLINICAL DATA: Thyroid nodules. Previous FNA biopsy of dominant
isthmic and left lesions 04/09/2011.

EXAM:
THYROID ULTRASOUND
TECHNIQUE: Ultrasound examination of the thyroid gland and adjacent soft
tissues was performed.

[Series 1: us soft tissue head/neck · 0.11mm/px · 14 of 63 slices shown]
[im 1/63]
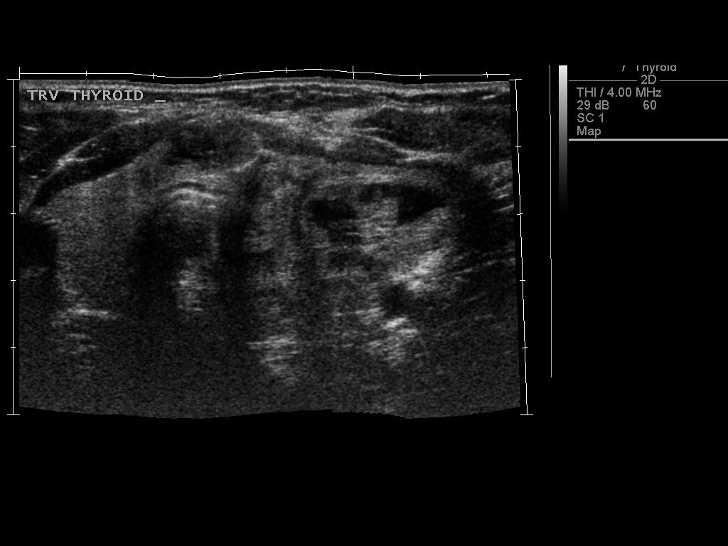
[im 6/63]
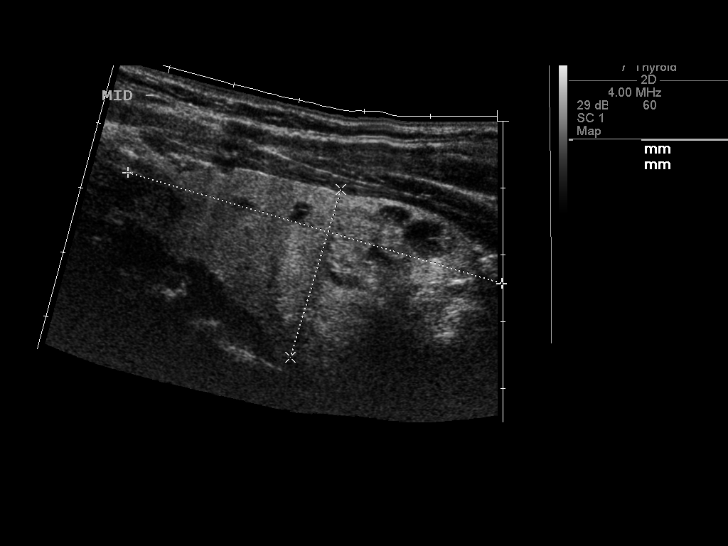
[im 11/63]
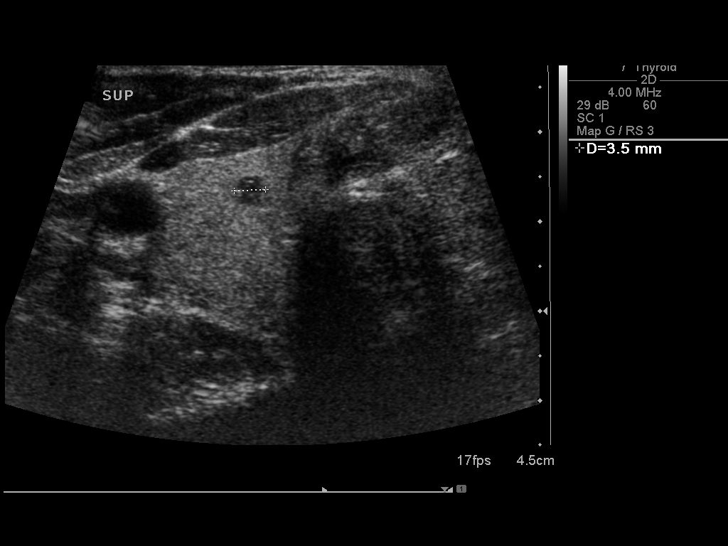
[im 16/63]
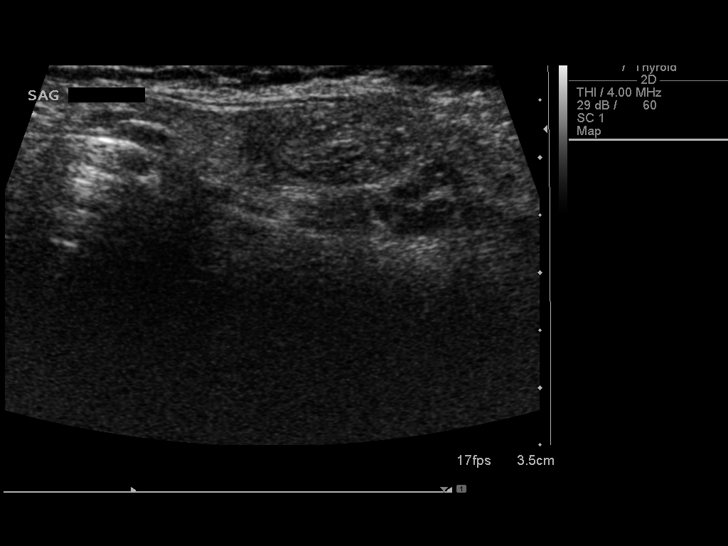
[im 21/63]
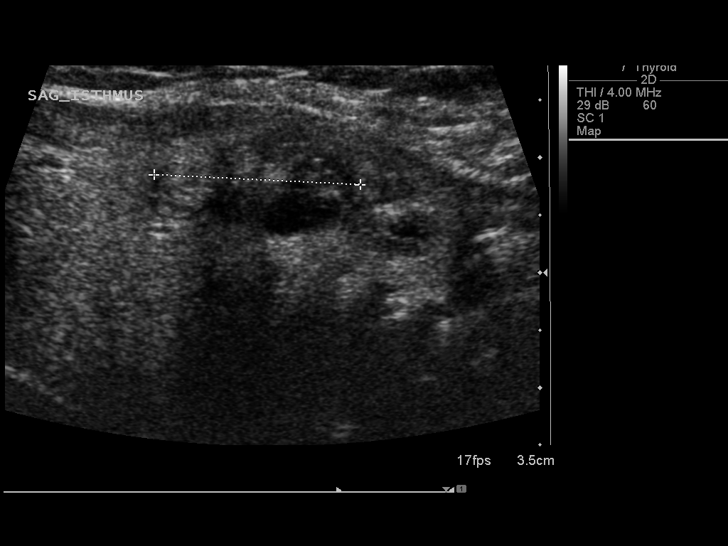
[im 24/63]
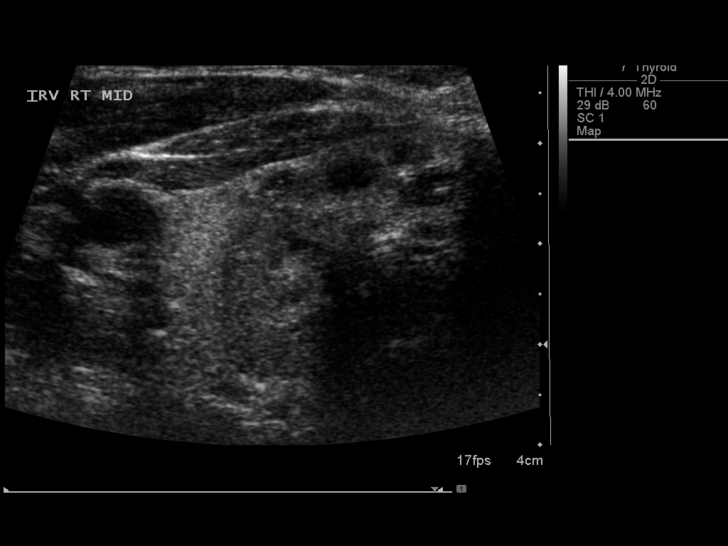
[im 29/63]
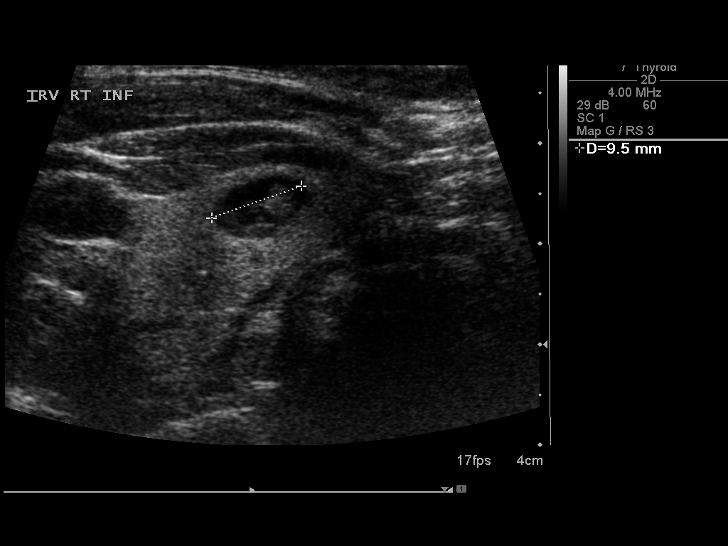
[im 34/63]
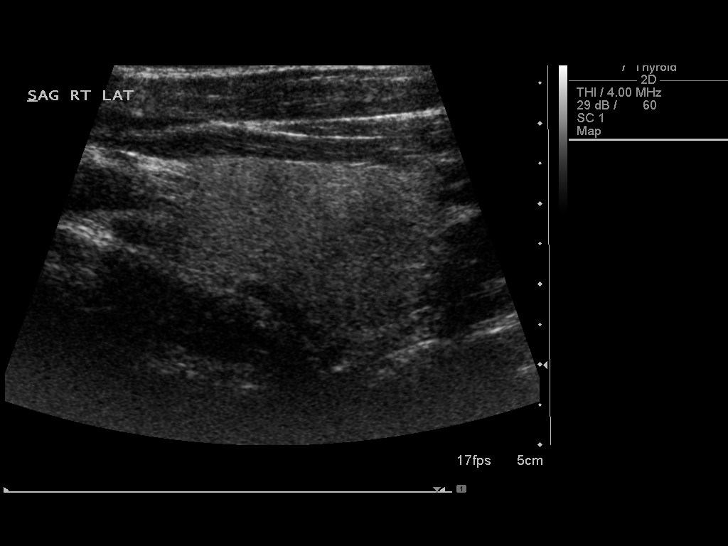
[im 39/63]
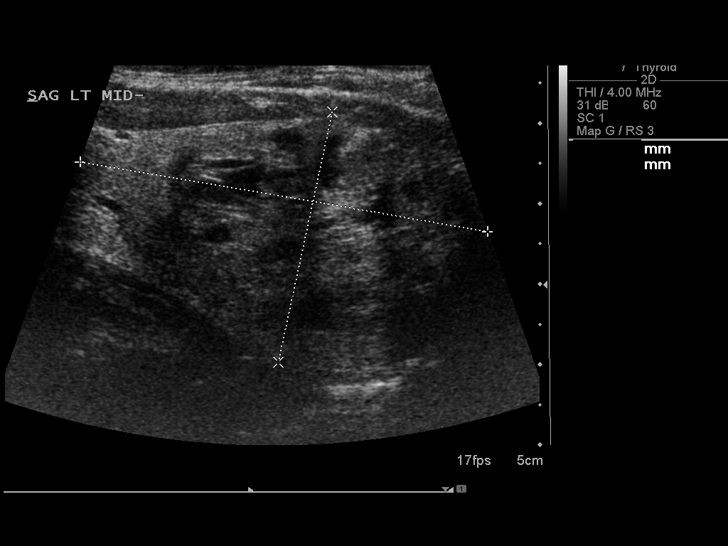
[im 42/63]
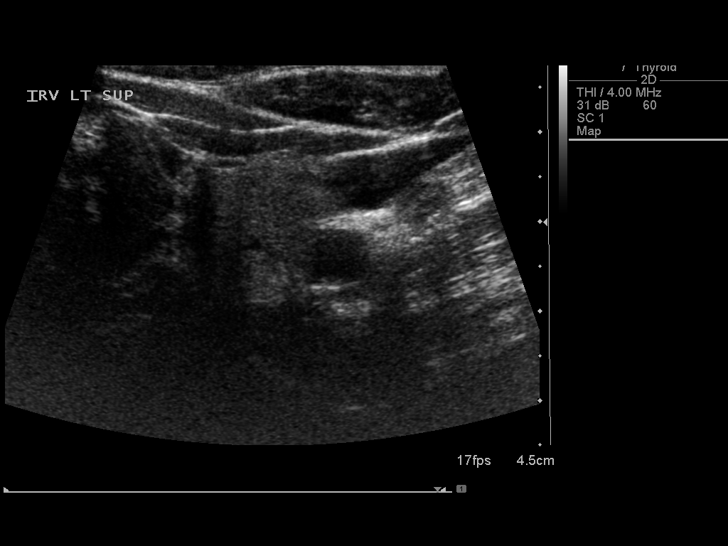
[im 47/63]
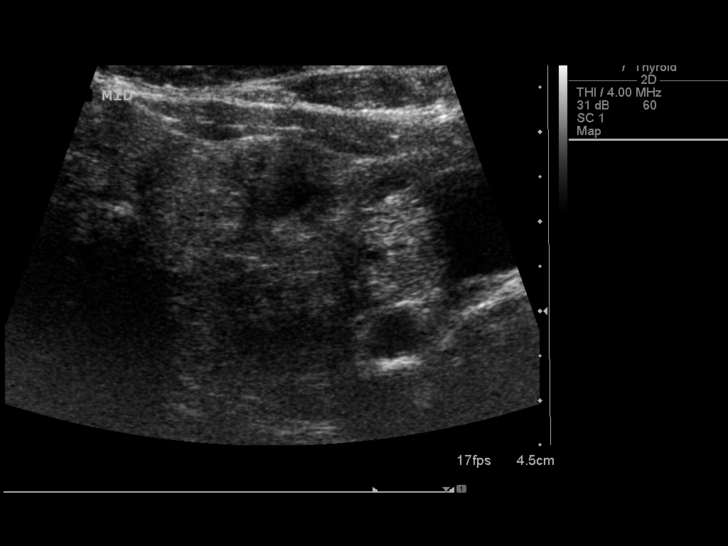
[im 52/63]
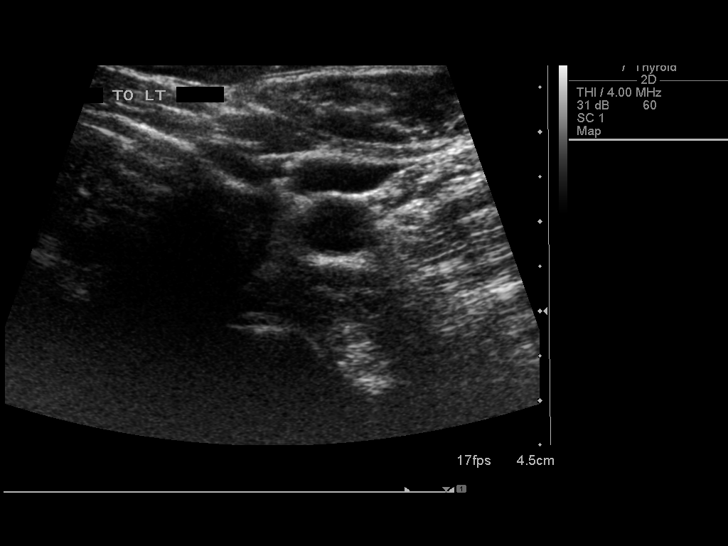
[im 57/63]
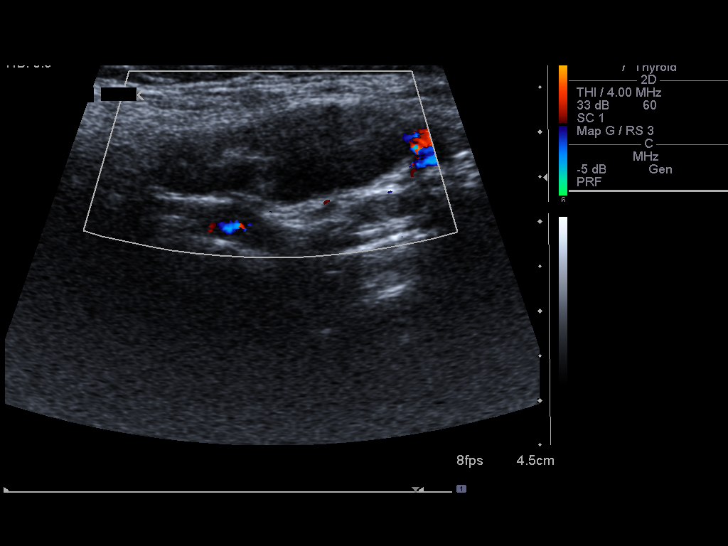
[im 63/63]
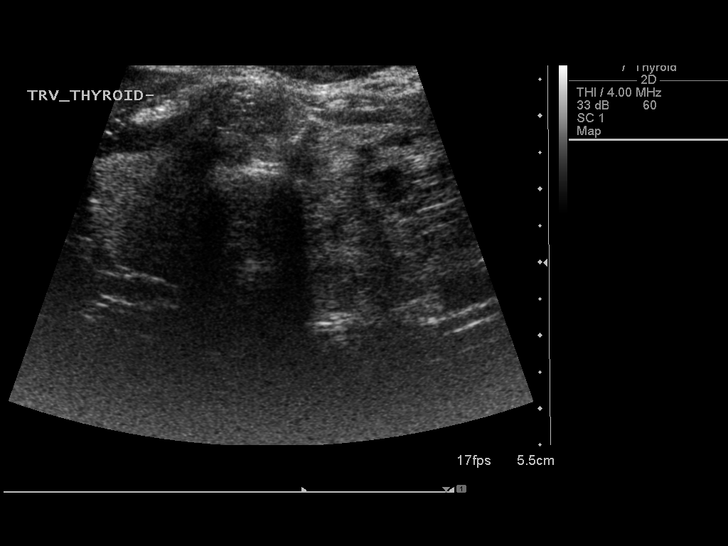

[14 of 25 positions shown; findings below may reference images not displayed]

FINDINGS: Right thyroid lobe

Measurements: 58 x 26 x 20 mm. Heterogeneous echotexture with
multiple small nodules. Largest 11 x 9 x 10 mm solid, mid lobe
(previously 16 mm). At least 3 more, all less than 1 cm.

Left thyroid lobe

Measurements: 51 x 32 x 32 mm. There is a dominant inferior pole
nodule 36 x 37 x 27 mm mostly solid (previously 34 mm).

Isthmus

Thickness: 9.6 mm. Two adjacent nodules to the right of midline, the
superior 15 x 8 x 17 mm, the inferior 15 x 8 x 18 mm (previously
measured together at 2.1 cm).

Lymphadenopathy

None visualized.
IMPRESSION: 1. Thyromegaly with multiple nodules. Slight increase in size of
dominant inferior left nodule. Correlate with previous biopsy
results.

## 2017-12-06 ENCOUNTER — Encounter (HOSPITAL_BASED_OUTPATIENT_CLINIC_OR_DEPARTMENT_OTHER): Payer: Self-pay | Admitting: Emergency Medicine

## 2017-12-06 ENCOUNTER — Emergency Department (HOSPITAL_BASED_OUTPATIENT_CLINIC_OR_DEPARTMENT_OTHER)
Admission: EM | Admit: 2017-12-06 | Discharge: 2017-12-07 | Disposition: A | Discharge status: Home / Self Care | Payer: Commercial Managed Care - PPO | Attending: Emergency Medicine | Admitting: Emergency Medicine

## 2017-12-06 ENCOUNTER — Other Ambulatory Visit: Payer: Self-pay

## 2017-12-06 ENCOUNTER — Emergency Department (HOSPITAL_BASED_OUTPATIENT_CLINIC_OR_DEPARTMENT_OTHER): Payer: Commercial Managed Care - PPO

## 2017-12-06 DIAGNOSIS — B349 Viral infection, unspecified: Secondary | ICD-10-CM | POA: Diagnosis not present

## 2017-12-06 DIAGNOSIS — M791 Myalgia, unspecified site: Secondary | ICD-10-CM | POA: Insufficient documentation

## 2017-12-06 DIAGNOSIS — J45909 Unspecified asthma, uncomplicated: Secondary | ICD-10-CM | POA: Insufficient documentation

## 2017-12-06 DIAGNOSIS — R109 Unspecified abdominal pain: Secondary | ICD-10-CM | POA: Diagnosis not present

## 2017-12-06 DIAGNOSIS — R52 Pain, unspecified: Secondary | ICD-10-CM

## 2017-12-06 LAB — CBC WITH DIFFERENTIAL/PLATELET
Basophils Absolute: 0 10*3/uL (ref 0.0–0.1)
Basophils Relative: 0 %
EOS ABS: 0.1 10*3/uL (ref 0.0–0.7)
EOS PCT: 1 %
HCT: 36.8 % (ref 36.0–46.0)
Hemoglobin: 12.6 g/dL (ref 12.0–15.0)
LYMPHS ABS: 0.3 10*3/uL — AB (ref 0.7–4.0)
Lymphocytes Relative: 6 %
MCH: 28.9 pg (ref 26.0–34.0)
MCHC: 34.2 g/dL (ref 30.0–36.0)
MCV: 84.4 fL (ref 78.0–100.0)
MONOS PCT: 8 %
Monocytes Absolute: 0.4 10*3/uL (ref 0.1–1.0)
Neutro Abs: 4.5 10*3/uL (ref 1.7–7.7)
Neutrophils Relative %: 85 %
PLATELETS: 285 10*3/uL (ref 150–400)
RBC: 4.36 MIL/uL (ref 3.87–5.11)
RDW: 13.5 % (ref 11.5–15.5)
WBC: 5.3 10*3/uL (ref 4.0–10.5)

## 2017-12-06 LAB — COMPREHENSIVE METABOLIC PANEL
ALK PHOS: 85 U/L (ref 38–126)
ALT: 23 U/L (ref 14–54)
AST: 35 U/L (ref 15–41)
Albumin: 4 g/dL (ref 3.5–5.0)
Anion gap: 12 (ref 5–15)
BUN: 11 mg/dL (ref 6–20)
CHLORIDE: 101 mmol/L (ref 101–111)
CO2: 23 mmol/L (ref 22–32)
CREATININE: 0.81 mg/dL (ref 0.44–1.00)
Calcium: 8.9 mg/dL (ref 8.9–10.3)
GFR calc Af Amer: >60 mL/min (ref >60)
Glucose, Bld: 183 mg/dL — ABNORMAL HIGH (ref 65–99)
Potassium: 3.4 mmol/L — ABNORMAL LOW (ref 3.5–5.1)
Sodium: 136 mmol/L (ref 135–145)
Total Bilirubin: 0.5 mg/dL (ref 0.3–1.2)
Total Protein: 7.6 g/dL (ref 6.5–8.1)

## 2017-12-06 LAB — I-STAT CG4 LACTIC ACID, ED: Lactic Acid, Venous: 2.74 mmol/L (ref 0.5–1.9)

## 2017-12-06 MED ORDER — KETOROLAC TROMETHAMINE 30 MG/ML IJ SOLN
30.0000 mg | Freq: Once | INTRAMUSCULAR | Status: AC
  Administered 2017-12-06: 30 mg via INTRAVENOUS
  Filled 2017-12-06: qty 1

## 2017-12-06 MED ORDER — SODIUM CHLORIDE 0.9 % IV BOLUS
1000.0000 mL | Freq: Once | INTRAVENOUS | Status: AC
  Administered 2017-12-06: 1000 mL via INTRAVENOUS

## 2017-12-06 MED ORDER — ACETAMINOPHEN 325 MG PO TABS
650.0000 mg | ORAL_TABLET | Freq: Once | ORAL | Status: DC | PRN
  Filled 2017-12-06: qty 2

## 2017-12-06 MED ORDER — ONDANSETRON HCL 4 MG/2ML IJ SOLN
4.0000 mg | Freq: Once | INTRAMUSCULAR | Status: AC
  Administered 2017-12-06: 4 mg via INTRAVENOUS
  Filled 2017-12-06: qty 2

## 2017-12-06 NOTE — ED Triage Notes (Signed)
patient states that she has aches all over and just doesn't feel good. She multiple complaints that include SOB, increased urination, headache fever and pressure in her ear

## 2017-12-06 NOTE — ED Provider Notes (Signed)
[facility] EMERGENCY DEPARTMENT Provider Note   CSN: [id number] Arrival date & time: 12/06/17  2219     History   Chief Complaint Chief Complaint  Patient presents with  . Generalized Body Aches    HPI Nancy [name] is a 47 y.o. female.  Patient presents with a 1 day history of body aches, headache, sore throat, frequency, urgency, bilateral ear pressure.  Fever at home to 101.  She last took Tylenol about 9 PM.  She denies any productive cough.  She does have pain with urination and hesitancy and urgency.  She describes "pain all over" associated with headache and abdominal pain and shortness of breath.  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  She is not not been out of the country recently.  Only medical problem is asthma.  She denies any focal weakness, numbness or tingling.  No vomiting or diarrhea.    The history is provided by the patient.    History reviewed. No pertinent past medical history.  Patient Active Problem List   Diagnosis Date Noted  . DELAYED MENSES 07/19/2010  . FIBROIDS, UTERUS 06/04/2010  . ALLERGIC RHINITIS 06/04/2010  . ASTHMA 06/04/2010    History reviewed. No pertinent surgical history.   OB History    Gravida  4   Para  3   Term  3   Preterm  0   AB  0   Living  3     SAB  0   TAB  0   Ectopic  0   Multiple  0   Live Births               Home Medications    Prior to Admission medications   Medication Sig Start Date End Date Taking? Authorizing Provider  amoxicillin (AMOXIL) 875 MG tablet Take 1 tablet (875 mg total) by mouth 2 (two) times daily. 07/27/12   Johnson, Clanford L, MD  ibuprofen (ADVIL,MOTRIN) 800 MG tablet Take 1 tablet (800 mg total) by mouth every 8 (eight) hours as needed for pain or fever (chills). 07/27/12   Johnson, Clanford L, MD  loratadine (CLARITIN) 10 MG tablet Take 1 tablet (10 mg total) by mouth as needed. 07/27/12   Johnson, Clanford L, MD  PRENATAL VITAMINS PO Take  by mouth.      [provider]    Family History History reviewed. No pertinent family history.  Social History Social History   Tobacco Use  . Smoking status: Never Smoker  . Smokeless tobacco: Never Used  Substance Use Topics  . Alcohol use: Never    Frequency: Never  . Drug use: Never     Allergies   Patient has no known allergies.   Review of Systems Review of Systems  Constitutional: Positive for activity change, appetite change, chills and fever.  HENT: Positive for congestion, rhinorrhea and sore throat. Negative for ear discharge.   Eyes: Negative for visual disturbance.  Respiratory: Positive for cough.   Cardiovascular: Negative for chest pain.  Gastrointestinal: Negative for abdominal pain, nausea and vomiting.  Genitourinary: Positive for dysuria, frequency and urgency. Negative for vaginal bleeding and vaginal discharge.  Musculoskeletal: Positive for arthralgias, back pain and myalgias.  Neurological: Positive for weakness and headaches. Negative for dizziness.    all other systems are negative except as noted in the HPI and PMH.    Physical Exam Updated Vital Signs BP 133/75 (BP Location: Left Arm)   Pulse (!) 105  Temp 99.1 F (37.3 C) (Oral)   Resp (!) 22   Ht 5\' 2"  (1.575 m)   Wt 88.5 kg (195 lb)   LMP 11/22/2017   SpO2 100%   BMI 35.67 kg/m   Physical Exam  Constitutional: She is oriented to person, place, and time. She appears well-developed and well-nourished. No distress.  HENT:  Head: Normocephalic and atraumatic.  Mouth/Throat: Oropharynx is clear and moist. No oropharyngeal exudate.  Oropharynx is erythematous, no asymmetry  TM clears with serous fluid  Eyes: Pupils are equal, round, and reactive to light. Conjunctivae and EOM are normal.  Neck: Normal range of motion. Neck supple.  No meningismus.  Cardiovascular: Normal rate, normal heart sounds and intact distal pulses.  No murmur heard. Tachycardic 110s    Pulmonary/Chest: Effort normal and breath sounds normal. No respiratory distress. She has no wheezes. She exhibits no tenderness.  Abdominal: Soft. There is no tenderness. There is no rebound and no guarding.  Mild diffuse tenderness, worse on the R side  Musculoskeletal: Normal range of motion. She exhibits no edema or tenderness.  Neurological: She is alert and oriented to person, place, and time. No cranial nerve deficit. She exhibits normal muscle tone. Coordination normal.  No ataxia on finger to nose bilaterally. No pronator drift. 5/5 strength throughout. CN 2-12 intact.Equal grip strength. Sensation intact.   Skin: Skin is warm. Capillary refill takes less than 2 seconds.  Psychiatric: She has a normal mood and affect. Her behavior is normal.  Nursing note and vitals reviewed.    ED Treatments / Results  Labs (all labs ordered are listed, but only abnormal results are displayed) Labs Reviewed  COMPREHENSIVE METABOLIC PANEL - Abnormal; Notable for the following components:      Result Value   Potassium 3.4 (*)    Glucose, Bld 183 (*)    All other components within normal limits  CBC WITH DIFFERENTIAL/PLATELET - Abnormal; Notable for the following components:   Lymphs Abs 0.3 (*)    All other components within normal limits  URINALYSIS, ROUTINE W REFLEX MICROSCOPIC - Abnormal; Notable for the following components:   Specific Gravity, Urine <1.005 (*)    Hgb urine dipstick TRACE (*)    All other components within normal limits  URINALYSIS, MICROSCOPIC (REFLEX) - Abnormal; Notable for the following components:   Bacteria, UA RARE (*)    All other components within normal limits  I-STAT CG4 LACTIC ACID, ED - Abnormal; Notable for the following components:   Lactic Acid, Venous 2.74 (*)    All other components within normal limits  RAPID STREP SCREEN ([facility] ONLY)  CULTURE, BLOOD (ROUTINE X 2)  CULTURE, BLOOD (ROUTINE X 2)  CULTURE, GROUP A STREP ([place])  PREGNANCY, URINE   I-STAT CG4 LACTIC ACID, ED    EKG None  Radiology Dg Chest 2 View  Result Date: 12/07/2017 CLINICAL DATA:  Fever and generalized aches.  Shortness of breath. EXAM: CHEST - 2 VIEW COMPARISON:  Radiographs 07/26/2008 FINDINGS: The cardiomediastinal contours are normal. Mild central bronchial thickening. Pulmonary vasculature is normal. No consolidation, pleural effusion, or pneumothorax. No acute osseous abnormalities are seen. IMPRESSION: Mild central bronchial thickening suggesting asthma or bronchitis. Electronically Signed   By: Jeb Levering M.D.   On: 12/07/2017 01:28   Ct Abdomen Pelvis W Contrast  Result Date: 12/07/2017 CLINICAL DATA:  Abdominal pain with fever EXAM: CT ABDOMEN AND PELVIS WITH CONTRAST TECHNIQUE: Multidetector CT imaging of the abdomen and pelvis was performed using the  standard protocol following bolus administration of intravenous contrast. CONTRAST:  138mL ISOVUE-300 IOPAMIDOL (ISOVUE-300) INJECTION 61% COMPARISON:  None. FINDINGS: Lower chest: No acute abnormality. Hepatobiliary: No focal liver abnormality is seen. Calcified gallstones. No surrounding inflammation. No biliary dilatation. Pancreas: Unremarkable. No pancreatic ductal dilatation or surrounding inflammatory changes. Spleen: Normal in size without focal abnormality. Adrenals/Urinary Tract: Adrenal glands are unremarkable. Kidneys are normal, without renal calculi, focal lesion, or hydronephrosis. Bladder is unremarkable. Stomach/Bowel: Stomach is within normal limits. Appendix appears normal. No evidence of bowel wall thickening, distention, or inflammatory changes. Vascular/Lymphatic: No significant vascular findings are present. No enlarged abdominal or pelvic lymph nodes. Reproductive: Enlarged uterus with multiple calcified masses. No adnexal mass. Other: Negative for free air or free fluid. Musculoskeletal: No acute or significant osseous findings. IMPRESSION: 1. No CT evidence for acute intra-abdominal  or pelvic abnormality. 2. Enlarged uterus with multiple calcified masses, presumably fibroids. 3. Gallstones Electronically Signed   By: Donavan Foil M.D.   On: 12/07/2017 03:26    Procedures Procedures (including critical care time)  Medications Ordered in ED Medications  acetaminophen (TYLENOL) tablet 650 mg (650 mg Oral Not Given 12/06/17 2239)  sodium chloride 0.9 % bolus 1,000 mL (has no administration in time range)  ondansetron (ZOFRAN) injection 4 mg (has no administration in time range)  ketorolac (TORADOL) 30 MG/ML injection 30 mg (has no administration in time range)     Initial Impression / Assessment and Plan / ED Course  I have reviewed the triage vital signs and the nursing notes.  Pertinent labs & imaging results that were available during my care of the patient were reviewed by me and considered in my medical decision making (see chart for details).    1 day of diffuse body aches, chills, fever, cough, urinary symptoms and shortness of breath.  IVF given. CXR negative, UA and rapid strep negative.  Suspect viral syndrome, possibly influenza. Low suspicion for meningitis. No meningismus. Initially elevated lactate has cleared with fluids.   CT scan obtained as patient did have some right lower quadrant tenderness but this was negative.  Discussed her gallstones as these do not appear to be causing her pain at this time.  Work-up is reassuring.  No evidence of bacterial infection. suspect viral syndrome.  Discussed possibility of meningitis with patient but she agrees this is less likely at this time.  She has no meningismus and no pain with range of motion of her head.  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 Risks and benefits of lumbar puncture discussed with patient and she declines at this time. Low suspicion for bacterial meningitis.   We will treat supportively with antipyretics, p.o. Fluids.  Patient requesting  antibiotics for possible sinusitis. Discussed followup with PCP. Return precautions discussed.   Final Clinical Impressi(s) / ED Diagnoses   Final diagnoses:  Viral syndrome  Body aches    ED Discharge Orders    None       Shelsy Seng, Annie Main, MD 12/07/17 [phone]

## 2017-12-07 ENCOUNTER — Emergency Department (HOSPITAL_BASED_OUTPATIENT_CLINIC_OR_DEPARTMENT_OTHER): Payer: Commercial Managed Care - PPO

## 2017-12-07 LAB — URINALYSIS, ROUTINE W REFLEX MICROSCOPIC
Bilirubin Urine: NEGATIVE
Glucose, UA: NEGATIVE mg/dL
KETONES UR: NEGATIVE mg/dL
LEUKOCYTES UA: NEGATIVE
NITRITE: NEGATIVE
PROTEIN: NEGATIVE mg/dL
Specific Gravity, Urine: <1.005 — ABNORMAL LOW (ref 1.005–1.030)
pH: 6 (ref 5.0–8.0)

## 2017-12-07 LAB — PREGNANCY, URINE: PREG TEST UR: NEGATIVE

## 2017-12-07 LAB — URINALYSIS, MICROSCOPIC (REFLEX)

## 2017-12-07 LAB — RAPID STREP SCREEN (MED CTR MEBANE ONLY): Streptococcus, Group A Screen (Direct): NEGATIVE

## 2017-12-07 LAB — I-STAT CG4 LACTIC ACID, ED: Lactic Acid, Venous: 0.97 mmol/L (ref 0.5–1.9)

## 2017-12-07 MED ORDER — IOPAMIDOL (ISOVUE-300) INJECTION 61%
100.0000 mL | Freq: Once | INTRAVENOUS | Status: AC | PRN
  Administered 2017-12-07: 100 mL via INTRAVENOUS

## 2017-12-07 MED ORDER — ACETAMINOPHEN 325 MG PO TABS
650.0000 mg | ORAL_TABLET | Freq: Once | ORAL | Status: AC
  Administered 2017-12-07: 650 mg via ORAL
  Filled 2017-12-07: qty 2

## 2017-12-07 MED ORDER — AMOXICILLIN-POT CLAVULANATE 875-125 MG PO TABS: 1.0000 | ORAL_TABLET | Freq: Two times a day (BID) | ORAL | 0 refills | Status: AC

## 2017-12-07 NOTE — ED Notes (Signed)
Ambulated patient via physicians order. Patient maintained oxygen saturation of 95-98 % with heart rate of 105-110. Patient maintained steady gait without difficulty. No Dyspnea noted. Patient returned to room. Tolerated well.

## 2017-12-07 NOTE — ED Notes (Signed)
C/O H/A returning and generalized body aches.

## 2017-12-07 NOTE — Discharge Instructions (Addendum)
As we discussed your symptoms are likely caused by a virus, possibly influenza or another kind of virus.  Keep yourself hydrated.  Take Tylenol or ibuprofen as needed for aches and fevers.  Follow-up with your doctor.  Return to the ED if you develop worsening symptoms including fever, neck stiffness, nausea, vomiting or other concerns.

## 2017-12-07 NOTE — ED Notes (Signed)
Pt given d/c instructions as per chart. Rx x 1. Verbalizes understanding. No questions. 

## 2017-12-09 LAB — CULTURE, GROUP A STREP (THRC)

## 2017-12-12 LAB — CULTURE, BLOOD (ROUTINE X 2)
CULTURE: NO GROWTH
Culture: NO GROWTH
SPECIAL REQUESTS: ADEQUATE
Special Requests: ADEQUATE

## 2020-06-26 ENCOUNTER — Other Ambulatory Visit: Payer: Commercial Managed Care - PPO

## 2022-11-20 ENCOUNTER — Encounter (INDEPENDENT_AMBULATORY_CARE_PROVIDER_SITE_OTHER): Payer: Self-pay | Admitting: Adult Health
